# Patient Record
Sex: Female | Born: 1980 | Hispanic: Yes | Marital: Married | Smoking: Never smoker
Health system: Southern US, Community
[De-identification: ages and names within clinical notes are randomized; demographics above are authoritative.]

## PROBLEM LIST (undated history)

## (undated) DIAGNOSIS — Z789 Other specified health status: Secondary | ICD-10-CM

---

## 2001-10-15 ENCOUNTER — Inpatient Hospital Stay (HOSPITAL_COMMUNITY): Admission: AD | Admit: 2001-10-15 | Discharge: 2001-10-15 | Payer: Self-pay | Admitting: Obstetrics and Gynecology

## 2001-10-15 ENCOUNTER — Encounter (INDEPENDENT_AMBULATORY_CARE_PROVIDER_SITE_OTHER): Payer: Self-pay | Admitting: *Deleted

## 2001-10-16 ENCOUNTER — Ambulatory Visit (HOSPITAL_COMMUNITY): Admission: RE | Admit: 2001-10-16 | Discharge: 2001-10-16 | Payer: Self-pay | Admitting: Obstetrics and Gynecology

## 2002-01-14 ENCOUNTER — Inpatient Hospital Stay (HOSPITAL_COMMUNITY): Admission: AD | Admit: 2002-01-14 | Discharge: 2002-01-16 | Payer: Self-pay | Admitting: Family Medicine

## 2005-08-10 ENCOUNTER — Other Ambulatory Visit: Admission: RE | Admit: 2005-08-10 | Discharge: 2005-08-10 | Payer: Self-pay | Admitting: Gynecology

## 2005-11-05 ENCOUNTER — Inpatient Hospital Stay (HOSPITAL_COMMUNITY): Admission: AD | Admit: 2005-11-05 | Discharge: 2005-11-05 | Payer: Self-pay | Admitting: Gynecology

## 2005-11-22 ENCOUNTER — Inpatient Hospital Stay (HOSPITAL_COMMUNITY): Admission: AD | Admit: 2005-11-22 | Discharge: 2005-11-25 | Payer: Self-pay | Admitting: Gynecology

## 2005-11-22 ENCOUNTER — Encounter (INDEPENDENT_AMBULATORY_CARE_PROVIDER_SITE_OTHER): Payer: Self-pay | Admitting: *Deleted

## 2005-11-22 DIAGNOSIS — O321XX0 Maternal care for breech presentation, not applicable or unspecified: Secondary | ICD-10-CM

## 2006-01-07 ENCOUNTER — Other Ambulatory Visit: Admission: RE | Admit: 2006-01-07 | Discharge: 2006-01-07 | Payer: Self-pay | Admitting: Gynecology

## 2007-02-18 IMAGING — US US OB LIMITED
1 series · 14 of 22 positions shown · non-contrast
Comparison: none

CLINICAL DATA: 34.5 weeks pregnant.

[Series 1: us ob limited · 0.29mm/px · 14 of 22 slices shown]
[im 1/22]
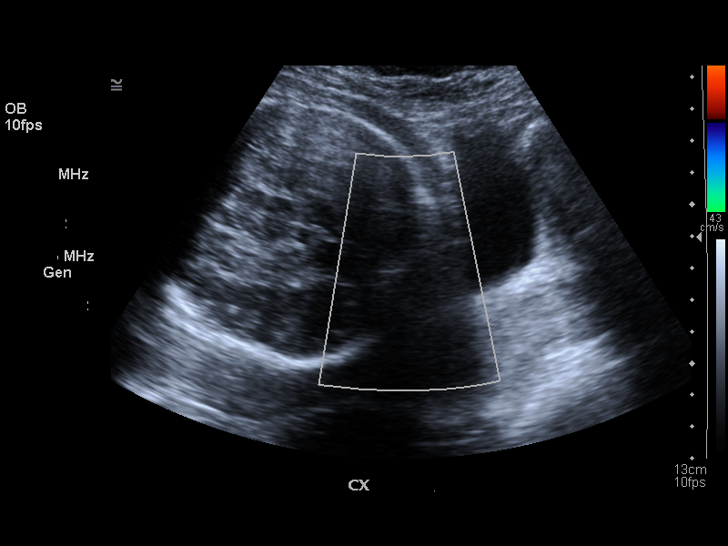
[im 3/22]
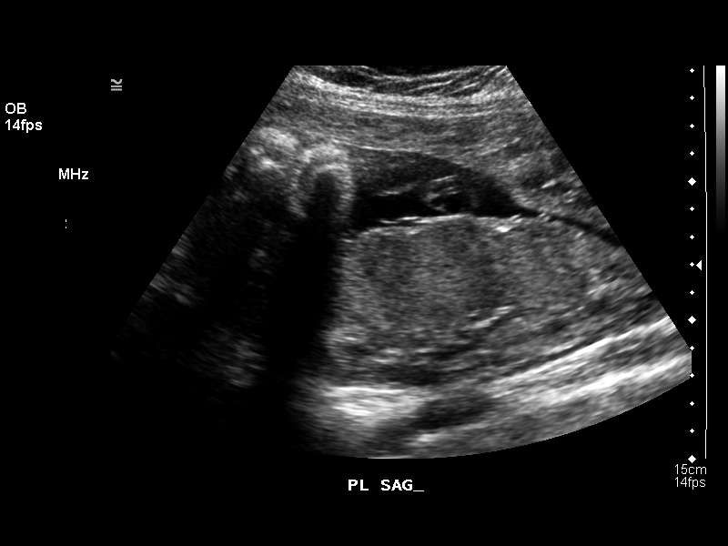
[im 4/22]
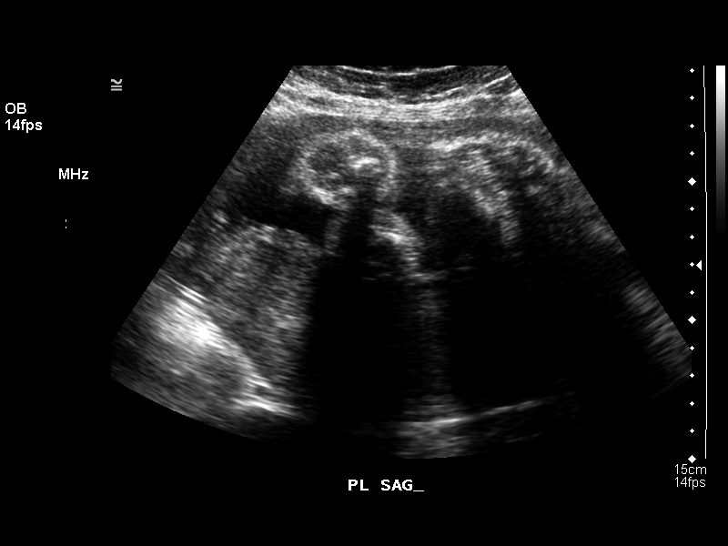
[im 6/22]
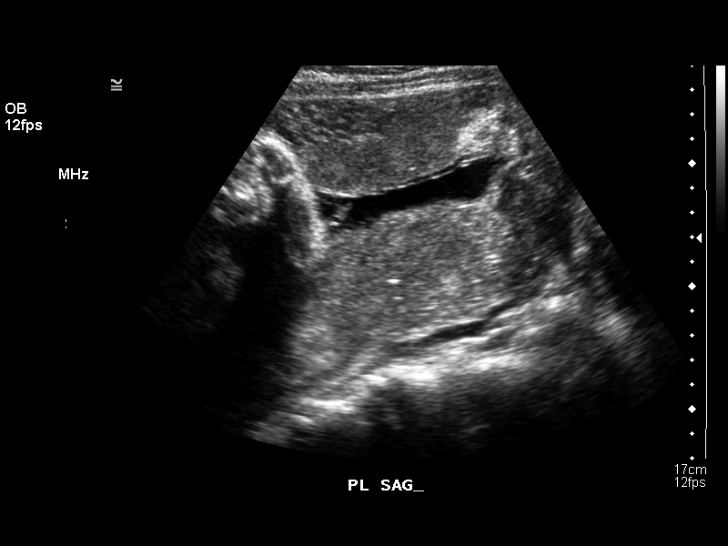
[im 8/22]
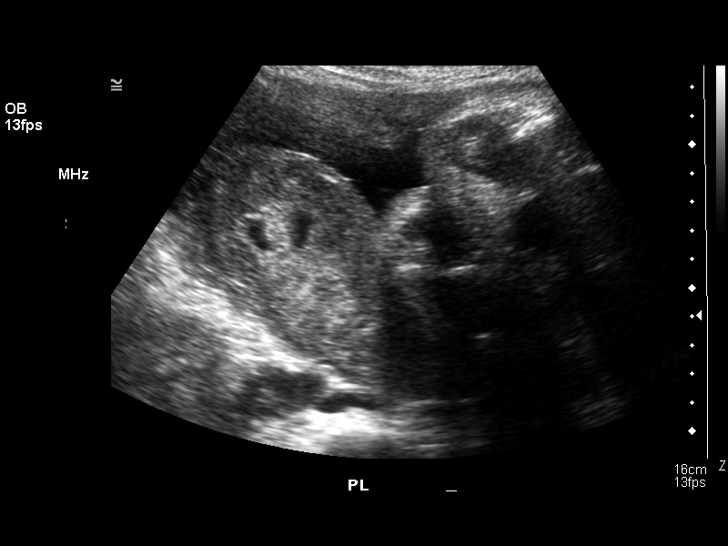
[im 9/22]
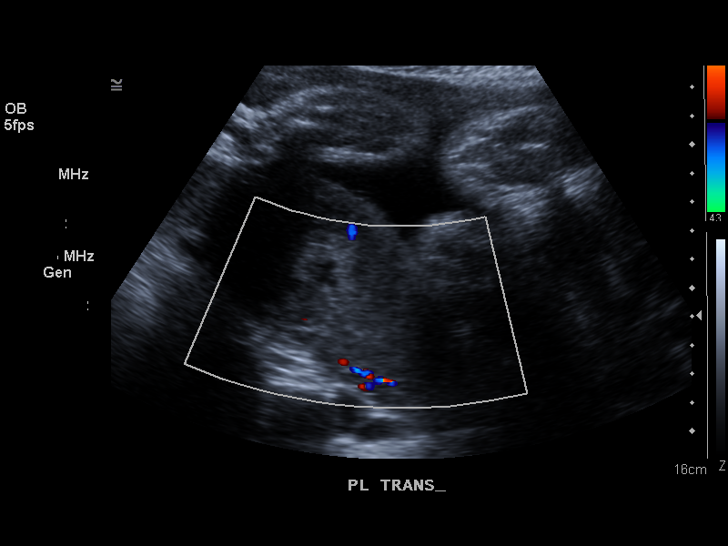
[im 11/22]
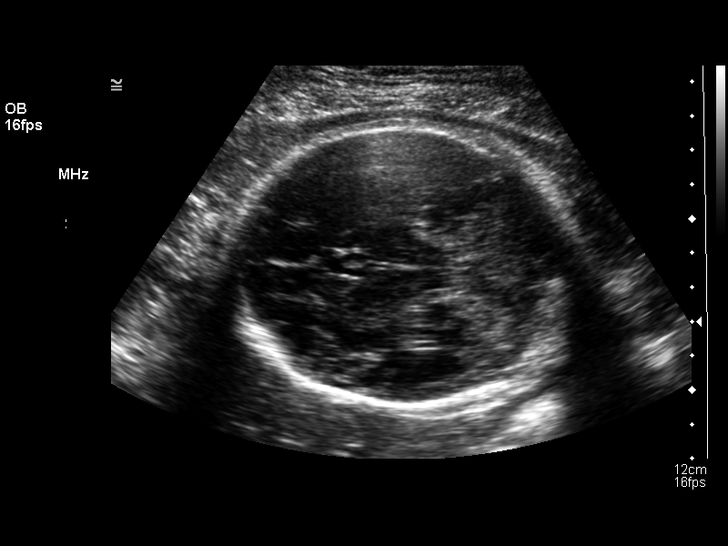
[im 12/22]
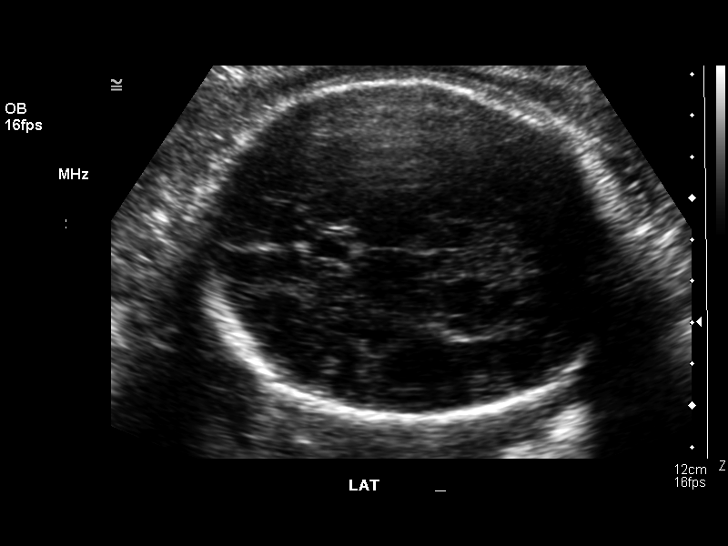
[im 14/22]
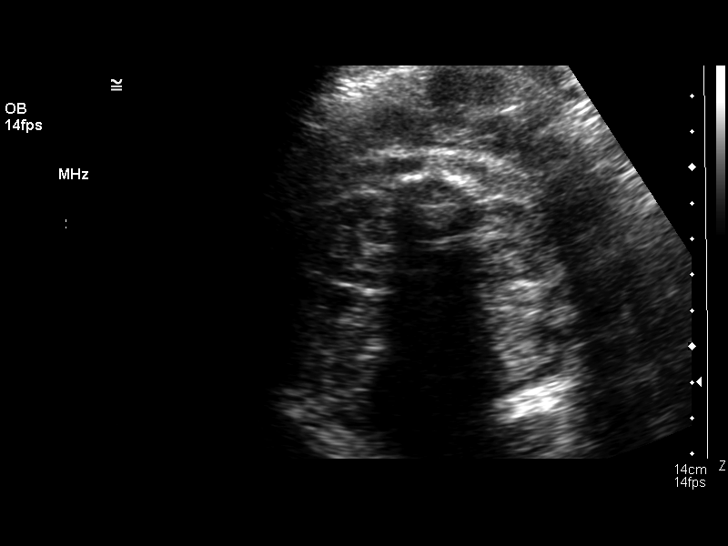
[im 15/22]
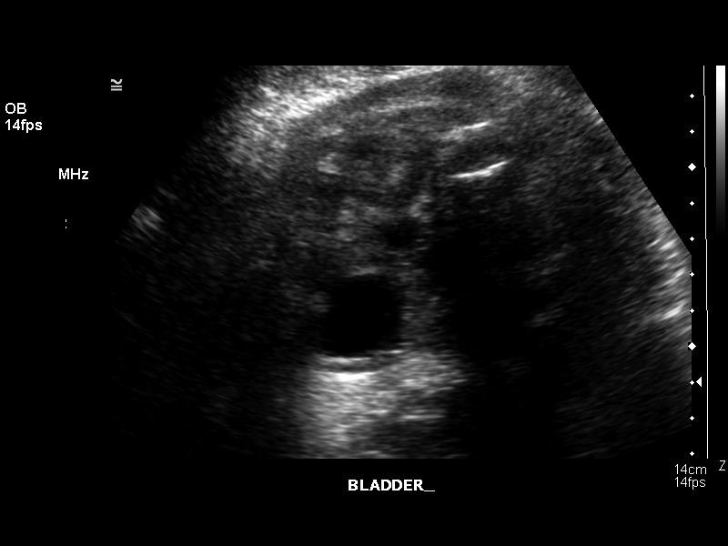
[im 17/22]
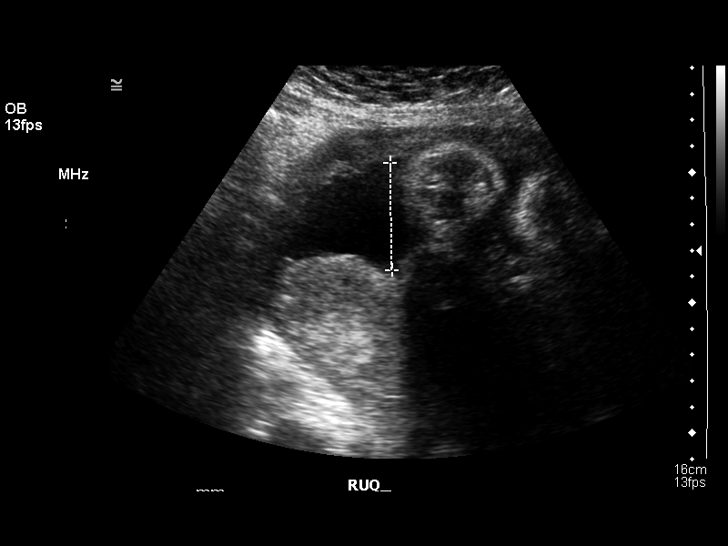
[im 19/22]
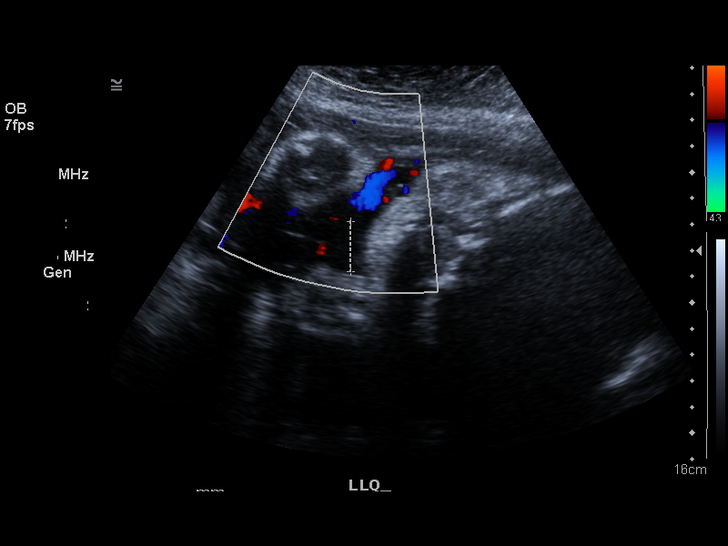
[im 20/22]
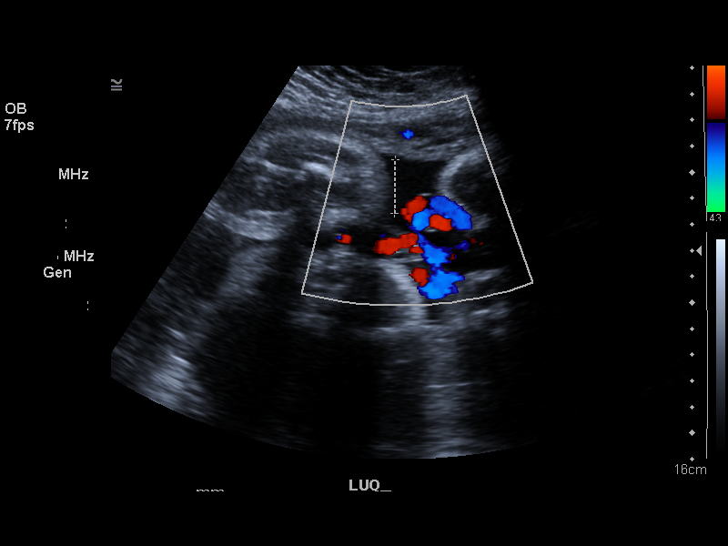
[im 22/22]
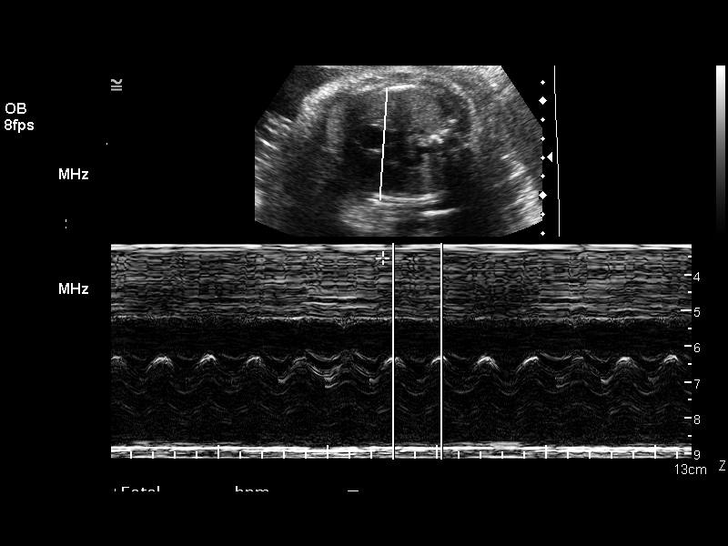

[14 of 22 positions shown; findings below may reference images not displayed]

LIMITED OBSTETRICAL ULTRASOUND:
Number of Fetuses:  1
Heart Rate:  136 bpm
Movement:  Yes
Breathing:  No
Presentation:  Cephalic
Placental Location:  Right lateral
Grade:  II
Previa:  No
Amniotic Fluid (Subjective):  Low normal
Amniotic Fluid (Objective):  AFI 9.7 cm (5th-15th %ile = 7.9 to 24.9 cm for 35 weeks) 

Fetal measurements and complete anatomic evaluation were not requested.  The following fetal anatomy was visualized during this exam:  Lateral ventricles, thalami, stomach, 3-vessel cord, kidneys, and bladder.

MATERNAL UTERINE AND ADNEXAL FINDINGS
Cervix:  Not evaluated;>34 weeks.
IMPRESSION: Single fetus in cephalic position.  Placenta is along the right lateral aspect and is grade II.  Quantity of amniotic fluid is diminished as noted above.

## 2009-05-09 ENCOUNTER — Other Ambulatory Visit: Admission: RE | Admit: 2009-05-09 | Discharge: 2009-05-09 | Payer: Self-pay | Admitting: Gynecology

## 2009-05-09 ENCOUNTER — Ambulatory Visit: Payer: Self-pay | Admitting: Gynecology

## 2010-07-21 NOTE — Op Note (Signed)
NAME:  RHENDA, [place]           ACCOUNT NO.:  [id number]   MEDICAL RECORD NO.:  [id number]          PATIENT TYPE:  INP   LOCATION:  9131                          FACILITY:  [facility]   PHYSICIAN:  Juan H. Lily Peer, M.D.DATE OF BIRTH:  12-10-1980   DATE OF PROCEDURE:  11/22/2005  DATE OF DISCHARGE:                                 OPERATIVE REPORT   INDICATIONS FOR OPERATION:  A 30 year old gravida 2, para 1 at term at 43-  1/[redacted] weeks gestation with oligohydramnios and incomplete breech presentation.   PREOPERATIVE DIAGNOSES:  1. Term intrauterine pregnancy at 37-1/2 weeks estimated gestational age.  2. Oligohydramnios.  3. Incomplete breech presentation.   POSTOPERATIVE DIAGNOSES:  1. Term intrauterine pregnancy at 37-1/2 weeks estimated gestational age.  2. Oligohydramnios.  3. Incomplete breech presentation.   PROCEDURE PERFORMED:  Primary lower uterine segment transverse cesarean  section.   SURGEON:  Juan H. Lily Peer, M.D.   ANESTHESIA:  Spinal.   FINDINGS:  A viable female infant, Apgars of 9 and 9, with a weight of 5  pounds 12 ounces, in an incomplete breech presentation, decreased amniotic  fluid and clear, and thin umbilical cord with no Wharton's jelly.  Normal  maternal pelvic anatomy.   DESCRIPTION OF OPERATION:  After the patient adequately counseled, she was  taken to the operating room where she underwent successful spinal  anesthesia.  The abdomen was prepped and draped in usual sterile fashion.  A  Foley catheter had been inserted in an effort to monitor urinary output.   A Pfannenstiel skin incision was made 2 cm above the symphysis pubis.  The  incision was carried down from the skin, subcutaneous tissue, down to the  rectus fascia.  Of note, 0.25% Marcaine for a total 10 cc had been  infiltrated in the planned incision site for postoperative analgesia.  The  fascia was incised in a transverse fashion.  The midline raphe was entered  cautiously.  The  bladder flap was established.  The lower uterine segment  was incised in a transverse fashion.  Clear, although diminished amniotic  fluid was present.  The fetus was delivered in the breech method/extraction,  the feet followed by the arms and then the head.  The nasopharyngeal area  was bulb suctioned.  The newborn gave immediate cry.  The cord was doubly  clamped and excised.  The infant was shown to the mother and passed off to  the pediatrician who was in attendance and gave the above-mentioned  parameters.  After cord blood was obtained, Ancef was given IV.  A Pitocin  drip was started.  The placenta and cord were delivered and passed off the  operative field and sent off for histological evaluation.  The uterus was  then exteriorized.  The intrauterine cavity was swept clear of remaining  products of conception and irrigated, and the transverse incision was closed  in a double layered fashion, first in a locking stitch fashion followed by  second layer in imbricating fashion with 0 Vicryl suture.  The uterus was  placed back in the pelvic cavity.  The pelvic cavity copiously irrigated  with normal saline solution.  After ascertaining adequate hemostasis,  closure was started.  The visceral peritoneum was not reapproximated, but  the rectus fascia was closed with a running stitch of 0 Vicryl suture.  The  subcutaneous bleeders were Bovie cauterized.  The skin was reapproximated  with skin clips followed by placing Xeroform gauze followed by a dressing.   The patient was transferred to the recovery room with stable vital signs.  Blood loss from procedure was recorded at 600 cc.  Urine output 300 cc,  and  IV fluids 3200 cc of  lactated Ringer's.      Juan H. Lily Peer, M.D.  Electronically Signed     JHF/MEDQ  D:  11/22/2005  T:  11/23/2005  Job:  [id number]

## 2010-07-21 NOTE — Discharge Summary (Signed)
NAME:  Amber Fuentes, Amber Fuentes           ACCOUNT NO.:  [id number]   MEDICAL RECORD NO.:  [id number]          PATIENT TYPE:  INP   LOCATION:  9131                          FACILITY:  [facility]   PHYSICIAN:  Ivor Costa. Farrel Gobble, M.D. DATE OF BIRTH:  [birth year]   DATE OF ADMISSION:  11/22/2005  DATE OF DISCHARGE:  11/25/2005                                 DISCHARGE SUMMARY   PRINCIPAL DIAGNOSIS:  Term pregnancy.   SECONDARY DIAGNOSES:  1. Oligohydramnios.  2. Breech presentation.   PROCEDURE:  Elective primary cesarean section.   HOSPITAL COURSE:  Refer to the dictated H&P.  The patient presented on the  morning of November 22, 2005, and underwent a primary cesarean section, low  flap transverse by Gaetano Hawthorne. Lily Peer, M.D. with delivery of a viable female,  birth weight 5 pounds 12 ounces, Apgars 9/9.  Postpartum course was  unremarkable.  The patient was transferred from the OR to the recovery room  and then to the postpartum floor in due fashion.  Postpartum course remained  unremarkable.  She remained afebrile and vital signs stable throughout.  On  postoperative day #3, the patient was breastfeeding without any complaints,  tolerating oral analgesics and ambulating without difficulty.   POSTOPERATIVE LABS:  Hemoglobin 9.7, hematocrit 28.3, white count 10, and  platelets 251.   She was discharged home in stable condition.  She was instructed to use over-  the-counter Motrin 600 mg every 6 hours p.r.n.  She was also given a  prescription for Percocet 5 one to two q.4 hours p.r.n. pain #40 without  refill.  She was instructed to follow up in the office 6 weeks  postoperatively.      Ivor Costa. Farrel Gobble, M.D.  Electronically Signed     THL/MEDQ  D:  11/25/2005  T:  11/27/2005  Job:  [id number]

## 2010-07-21 NOTE — H&P (Signed)
NAME:  Amber Fuentes, Amber Fuentes           ACCOUNT NO.:  [id number]   MEDICAL RECORD NO.:  [id number]          PATIENT TYPE:  INP   LOCATION:                                F[facility]ILITY:  [facility]   PHYSICIAN:  Juan H. Lily Peer, M.D.DATE OF BIRTH:  1980-08-17   DATE OF ADMISSION:  DATE OF DISCHARGE:                                HISTORY & PHYSICAL   CHIEF COMPLAINT:  1. Term intrauterine pregnancy at 37-1/2 weeks' gestation.  2. Oligohydramnios.  3. Incomplete breech.   HISTORY OF PRESENT ILLNESS:  The patient is a 30 year old gravida 2, para 1,  whose estimated date of confinement was scheduled to be October 6.  She is  currently 37-1/2 weeks' gestation.  She was a late entry into the practice  when she presented at 18-1/2 weeks' gestation, so a first trimester screen  was not done.  The patient did have alpha theta protein screening which was  found to be elevated, and a thorough ultrasound was done, at which time she  was found to have an AFI that was decreased to 10.1 in the sixth percentile  for 23-week gestation.  The patient had an extensive evaluation to include  consultation at [facility].  The patient also had been tested with the  following:  She had a comprehensive metabolic panel, liver function test,  renal function test.  Lupus anticoagulant, and toxoplasmosis, and  cytomegalovirus, syphilis, __________B19, and Herpes all were normal.  The  patient had thorough evaluation in [facility].  She had also been  offered amniocentesis and the patient had declined.  Initially she had a  placenta previa and many results marginal, with the last ultrasound  demonstrating the fetus to be in the incomplete breech presentation with a  low-lying placenta and fluid decrease in the previous scan on September 12,  whereby the AFI at that point had been 9.8 and it was down now to 7.7 and  the sixth percentile.  The patient is scheduled to undergo a primary  cesarean section tomorrow morning,  September 20.  The patient had been  followed by twice a week NSTs and AFI but due to decreasing amniotic fluid  index, her being at term, and baby in the incomplete breech presentation,  decided to proceed with a primary cesarean section.  The last complete  ultrasound for fetal growth had been done on September 12 and the fetus was  in the 21st percentile in growth.   PAST MEDICAL HISTORY:  The patient has had a normal spontaneous vaginal  delivery in 2003.  She denies any allergies.  No other medical problems  reported.   REVIEW OF SYSTEMS:  See [place] form.   PHYSICAL EXAMINATION:  VITAL SIGNS:  The patient today weighed 199 pounds,  blood pressure 122/82.  HEENT:  Unremarkable.  NECK:  Supple.  Trachea midline.  No carotid bruits.  No thyromegaly.  LUNGS:  Clear to auscultation without any rhonchi or wheezes.  HEART:  Regular rate and rhythm.  No murmurs or gallops.  BREASTS:  Exam not done.  ABDOMEN:  Gravid uterus, breech presentation by [facility] maneuver, confirmed  by ultrasound.  PELVIC:  Cervix long, closed, and __________.  EXTREMITIES:  Detail 1+ due to clonus.   LABORATORY DATA:  Prenatal labs:  O positive blood type, negative antibody  screen.  VDRL was nonreactive.  Rubella immune.  Hepatitis B surface antigen  and HIV were negative.  Alpha theta protein was found to be slightly  elevated.  No abnormalities were noted on ultrasound scan.  Diabetes screen  was normal.  GBS culture was negative.   ASSESSMENT:  A 30 year old gravida 2, para 1, at 37-1/2 weeks' gestation  with oligohydramnios, breech presentation.  No gross abnormality on  ultrasound.  The patient is being followed twice a week by NSTs and AFI.  Evidence of decreasing AFI.  The last one was today in the office with 7.7  cm at sixth percentile.  The fetus was found to be an incomplete breech and  a low-lying placenta was noted.  The patient was counseled as to risks,  benefits, and pros and cons of  cesarean section.  All questions were  answered and will follow accordingly.   PLAN:  The patient is scheduled for primary cesarean section on September 20  at 7:30 a.m. at [facility].      [place]ost[place] H. Lily Peer, M.D.  Electronically Signed     JHF/MEDQ  D:  11/21/2005  T:  11/21/2005  Job:  [id number]

## 2011-07-13 ENCOUNTER — Encounter: Payer: Self-pay | Admitting: Gynecology

## 2011-07-13 ENCOUNTER — Other Ambulatory Visit (HOSPITAL_COMMUNITY)
Admission: RE | Admit: 2011-07-13 | Discharge: 2011-07-13 | Disposition: A | Discharge status: Home / Self Care | Payer: 59 | Source: Ambulatory Visit | Attending: Gynecology | Admitting: Gynecology

## 2011-07-13 ENCOUNTER — Ambulatory Visit (INDEPENDENT_AMBULATORY_CARE_PROVIDER_SITE_OTHER): Payer: 59 | Admitting: Gynecology

## 2011-07-13 VITALS — BP 128/70 | Ht 62.25 in | Wt 185.0 lb

## 2011-07-13 DIAGNOSIS — Z01419 Encounter for gynecological examination (general) (routine) without abnormal findings: Secondary | ICD-10-CM

## 2011-07-13 DIAGNOSIS — R635 Abnormal weight gain: Secondary | ICD-10-CM

## 2011-07-13 NOTE — Progress Notes (Signed)
Amber Fuentes [facility] 16-May-1980 [id number]   History:    31 y.o.  for annual exam who has not been seen the office in 2 years. She reports normal menstrual cycles and has been using condoms for contraception. Review of her record indicated that her weight has remained stable for the past 2 years but she is overweight with a BMI of 33.57. Patient's otherwise asymptomatic.  Past medical history,surgical history, family history and social history were all reviewed and documented in the EPIC chart.  Gynecologic History Patient's last menstrual period was 06/24/2011. Contraception: condoms Last Pap: 2011. Results were: normal Last mammogram: Not indicated. Results were: Not indicated  Obstetric History OB History    Grav Para Term Preterm Abortions TAB SAB Ect Mult Living   2 2 2       2      # Outc Date GA Lbr Len/2nd Wgt Sex Del Anes PTL Lv   1 TRM     M SVD  No Yes   2 TRM     F CS  No Yes       ROS:  Was performed and pertinent positives and negatives are included in the history.  Exam: chaperone present  BP 128/70  Ht 5' 2.25" (1.581 m)  Wt 185 lb (83.915 kg)  BMI 33.57 kg/m2  LMP 06/24/2011  Body mass index is 33.57 kg/(m^2).  General appearance : Well developed well nourished female. No acute distress HEENT: Neck supple, trachea midline, no carotid bruits, no thyroidmegaly Lungs: Clear to auscultation, no rhonchi or wheezes, or rib retractions  Heart: Regular rate and rhythm, no murmurs or gallops Breast:Examined in sitting and supine position were symmetrical in appearance, no palpable masses or tenderness,  no skin retraction, no nipple inversion, no nipple discharge, no skin discoloration, no axillary or supraclavicular lymphadenopathy Abdomen: no palpable masses or tenderness, no rebound or guarding Extremities: no edema or skin discoloration or tenderness  Pelvic:  Bartholin, Urethra, Skene Glands: Within normal limits             Vagina: No gross lesions or  discharge  Cervix: No gross lesions or discharge  Uterus  anteverted, normal size, shape and consistency, non-tender and mobile  Adnexa  Without masses or tenderness  Anus and perineum  normal   Rectovaginal  normal sphincter tone without palpated masses or tenderness             Hemoccult no     Assessment/Plan:  31 y.o. female for annual exam for which her Pap smear was done today. We discussed new snare screening guidelines. Due to her being overweight a TSH along with a fasting blood sugar and fasting lipid profile will be drawn today along with her CBC and urinalysis. Literature information in Spanish was provided on the ParaGard T380A nonhormonal IUD. She'll discuss with her husband and schedule accordingly. She was reminded do her monthly self breast examination. Literature information on exercise and on diet was provided in Spanish as well. All questions answered we'll follow accordingly.    Ok Edwards MD, 4:06 PM 07/13/2011

## 2011-07-13 NOTE — Patient Instructions (Addendum)
Control del colesterol  Los niveles de colesterol en el organismo estn determinados significativamente por su dieta. Los niveles de colesterol tambin se relacionan con la enfermedad cardaca. El material que sigue ayuda a Software engineer relacin y a Chiropractor qu puede hacer para mantener su corazn sano. No todo el colesterol es [place]. Las lipoprotenas de baja densidad (LDL) forman el colesterol "malo". El colesterol malo puede ocasionar depsitos de grasa que se acumulan en el interior de las arterias. Las lipoprotenas de alta densidad (HDL) es el colesterol "bueno". Ayuda a remover el colesterol LDL "malo" de la [place]. El colesterol es un factor de riesgo muy importante para la enfermedad cardaca. Otros factores de riesgo son la hipertensin arterial, el hbito de fumar, el estrs, la herencia y [place].  El msculo cardaco obtiene el suministro de sangre a travs de las arterias coronarias. Si su colesterol LDL ("malo") est elevado y el HDL ("bueno") es bajo, tiene un factor de riesgo para que se formen depsitos de Holiday representative en las arterias coronarias (los vasos sanguneos que suministran sangre al corazn). Esto hace que haya menos lugar para que la sangre circule. Sin la suficiente sangre y oxgeno, el msculo cardaco no puede funcionar correctamente, y usted podr sentir dolores en el pecho (angina pectoris). Cuando una arteria coronaria se cierra completamente, una parte del msculo cardaco puede morir (infarto de miocardio). CONTROL DEL COLESTEROL Cuando el profesional que lo asiste enva la sangre al laboratorio para Artist nivel de colesterol, puede realizarle tambin un perfil completo de los lpidos. Con esta prueba, se puede determinar la cantidad total de colesterol, as como los niveles de LDL y HDL. Los triglicridos son un tipo de grasa que circula en la sangre y que tambin puede utilizarse para determinar el riesgo de enfermedad  cardaca. En la siguiente tabla se establecen los nmeros ideales: Prueba: Colesterol total  Menos de 200 mg/dl.  Prueba: LDL "colesterol malo"  Menos de 100 mg/dl.   Menos de 70 mg/dl si tiene riesgo muy elevado de sufrir un ataque cardaco o muerte cardaca sbita.  Prueba: HDL "colesterol bueno"  Mujeres: Ms de 50 mg/dl.   Hombres: Ms de 40 mg/dl.  Prueba: Trigliceridos  Menos de 150 mg/dl.  CONTROL DEL COLESTEROL CON DIETA Aunque factores como el ejercicio y el estilo de vida son importantes, la "primera lnea de ataque" es la dieta. Esto se debe a que se sabe que ciertos alimentos hacen subir el colesterol y otros lo [place]. El objetivo debe ser [employer] alimentos, de modo que tengan un efecto sobre el colesterol y, an ms importante, Microbiologist las grasas saturadas y trans con otros tipos de grasas, como las monoinsaturadas y las poliinsaturadas y cidos grasos omega-3 . En promedio, una persona no debe consumir ms de 15 a 17 g de grasas saturadas por [employer]. Las grasas saturadas y trans se consideran grasas "malas", ya que elevan el colesterol LDL. Las grasas saturadas se encuentran principalmente en productos animales como carne, Miles y crema. Pero esto no significa que usted Marketing executive todas sus comidas favoritas. Actualmente, como lo muestra el cuadro que figura al final de este documento, hay sustitutos de buen sabor, bajos en grasas y en colesterol, para la mayora de los alimentos que a usted Musician. Elija aquellos alimentos alternativos que sean bajos en grasas o sin grasas. Elija cortes de carne del cuarto trasero o lomo ya que estos cortes son los que tienen menor cantidad de grasa y Oncologist. El pollo (  sin piel), el pescado, la carne de ternera, y la [place] molida son excelentes opciones. Elimine las carnes [employer] o el salami. Los [employer] o nada de grasas saturadas. Cuando consuma carne [place], carne de aves de  corral, o pescado, hgalo en porciones de 85 gramos (3 onzas). Las grasas trans tambin se llaman "aceites parcialmente hidrogenados". Son aceites manipulados cientficamente de [place] que son slidos a Publishing rights manager, tienen una larga vida y Glass blower/designer sabor y la textura de los alimentos a los que se Scientist, clinical (histocompatibility and immunogenetics). Las grasas trans se encuentran en la [place], [place], crackers y alimentos horneados.  Para hornear y cocinar, el aceite es un excelente sustituto para la [place]. Los aceites monoinsaturados tienen un beneficio particular, ya que se cree que disminuyen el colesterol LDL (colesterol malo) y elevan el HDL. Deber evitar los aceites tropicales saturados como el de coco y el de [place].  Recuerde, adems, que puede comer sin restricciones los grupos de alimentos que son naturalmente libres de grasas saturadas y Neurosurgeon trans, entre los que se incluyen el pescado, las frutas (excepto el [place]), verduras, frijoles, cereales (cebada, arroz, [place], trigo) y las pastas (sin salsas con crema)  IDENTIFIQUE LOS ALIMENTOS QUE DISMINUYEN EL COLESTEROL  Pueden disminuir el colesterol las fibras solubles que estn en las frutas, como las [place], en los vegetales como el brcoli, las patatas y las zanahorias; en las legumbres como frijoles, guisantes y Therapist, occupational; y en los cereales como la cebada. Los alimentos fortificados con fitosteroles tambin Engineer, production. Debe consumir al menos 2 g de estos alimentos a diario para Financial planner de disminucin de [place].  En el supermercado, lea las etiquetas de los envases para identificar los alimentos bajos en grasas saturadas, libres de grasas trans y bajos en [place], . Elija quesos que tengan solo de 2 a 3 g de grasa saturada por onza (28,35 g). Use una margarina que no dae el corazn, [place] de grasas trans o aceite parcialmente hidrogenado. Al comprar alimentos horneados (galletitas dulces y Gaffer) evite el aceite parcialmente  hidrogenado. Los panes y bollos debern ser de granos enteros (harina de maz o de avena entera, en lugar de "harina" o "harina enriquecida"). Compre sopas en lata que no sean cremosas, con bajo contenido de sal y sin grasas adicionadas.  TCNICAS DE PREPARACIN DE LOS ALIMENTOS  Nunca fra los alimentos en aceite abundante. Si debe frer, hgalo en poco aceite y removiendo [place], porque as se utilizan muy pocas grasas, o utilice un spray antiadherente. Cuando le sea posible, hierva, hornee o ase las carnes y cocine los vegetales al vapor. En vez de [employer] con mantequilla o [place], utilice limn y hierbas, pur de Psychologist, educational y canela (para las calabazas y batatas), yogurt y salsa descremados y aderezos para ensaladas bajos en contenido graso.  BAJO EN GRASAS SATURADAS / SUSTITUTOS BAJOS EN GRASA  Carnes / Grasas saturadas (g)  Evite: Bife, corte graso (3 oz/85 g) / 11 g   Elija: Bife, corte magro (3 oz/85 g) / 4 g   Evite: Hamburguesa (3 oz/85 g) / 7 g   Elija:  [place] magra (3 oz/85 g) / 5 g   Evite: Jamn (3 oz/85 g) / 6 g   Elija:  Jamn magro (3 oz/85 g) / 2.4 g   Evite: Pollo, con piel (3 oz/85 g), Carne oscura / 4 g   Elija:  Pollo, sin piel (3 oz/85 g), Carne oscura / 2  g   Evite: Pollo, con piel (3 oz/85 g), Carne magra / 2.5 g   Elija: Pollo, sin piel (3 oz/85 g), Carne magra / 1 g  Lcteos / Grasas saturadas (g)  Evite: Leche entera (1 taza) / 5 g   Elija: Leche con bajo contenido de grasa, 2% (1 taza) / 3 g   Elija: Leche con bajo contenido de grasa, 1% (1 taza) / 1.5 g   Elija: Leche descremada (1 taza) / 0.3 g   Evite: Queso duro (1 oz/28 g) / 6 g   Elija: Queso descremado (1 oz/28 g) / 2-3 g   Evite: Queso cottage, 4% grasa (1 taza)/ 6.5 g   Elija: Queso cottage con bajo contenido de grasa, 1% grasa (1 taza)/ 1.5 g   Evite: Helado (1 taza) / 9 g   Elija: Sorbete (1 taza) / 2.5 g   Elija: Yogurt helado sin contenido de grasa  (1 taza) / 0.3 g   Elija: Barras de fruta congeladas / vestigios   Evite: Crema batida (1 cucharada) / 3.5 g   Elija: Batidos glac sin lcteos (1 cucharada) / 1 g  Condimentos / Grasas saturadas (g)  Evite: Mayonesa (1 cucharada) / 2 g   Elija: Mayonesa con bajo contenido de grasa (1 cucharada) / 1 g   Evite: [place] (1 cucharada) / 7 g   Elija: Margarina extra light (1 cucharada) / 1 g   Evite: Aceite de coco (1 cucharada) / 11.8 g   Elija: Aceite de oliva (1 cucharada) / 1.8 g   Elija: Aceite de maz (1 cucharada) / 1.7 g   Elija: Aceite de crtamo (1 cucharada) / 1.2 g   Elija: Aceite de girasol (1 cucharada) / 1.4 g   Elija: Aceite de soja (1 cucharada) / 2.4 g   Elija: Aceite de canola (1 cucharada) / 1 g  Document Released: 02/19/2005 Document Revised: 11/01/2010 [facility] Patient Information 2012 [place], [place].  Ejercicios para perder peso (Exercise to Lose Weight) La actividad fsica y [place] Dear dieta saludable ayudan a perder peso. El mdico podr sugerirle ejercicios especficos. IDEAS Y CONSEJOS PARA HACER EJERCICIOS  Elija opciones econmicas que disfrute hacer , como caminar, andar en bicicleta o los vdeos para ejercitarse.   Utilice las Microbiologist del ascensor.   Camine durante la hora del almuerzo.   Estacione el auto lejos del lugar de [place] o [place].   Concurra a un gimnasio o tome clases de gimnasia.   Comience con 5  10 minutos de actividad fsica por da. Ejercite hasta 30 minutos, 4 a 6 das por [address]   Utilice zapatos que tengan un buen soporte y ropas cmodas.   Elongue antes y despus de Company secretary.   Ejercite hasta que aumente la respiracin y el corazn palpite rpido.   Beba agua extra cuando ejercite.   No haga ejercicio Firefighter, sentirse mareado o que le falte mucho el aire.  La actividad fsica puede quemar alrededor de 150 caloras.  Correr 20 cuadras en 15 minutos.   Jugar vley durante 45 a 60 minutos.     Limpiar y encerar el auto durante 45 a 60 minutos.   Jugar ftbol americano de toque.   Caminar 25 cuadras en 35 minutos.   Empujar un cochecito 20 cuadras en 30 minutos.   Jugar baloncesto durante 30 minutos.   Rastrillar hojas secas durante 30 minutos.   Andar en bicicleta 80 cuadras en 30 minutos.   Caminar 30 cuadras en  30 minutos.   Bailar durante 30 minutos.   Quitar la nieve con una pala durante 15 minutos.   Nadar vigorosamente durante 20 minutos.   Subir escaleras durante 15 minutos.   Andar en bicicleta 60 cuadras durante 15 minutos.   Arreglar el jardn entre 30 y 45 minutos.   Saltar a la soga durante 15 minutos.   Limpiar vidrios o pisos durante 45 a 60 minutos.  Document Released: 05/26/2010 Document Revised: 11/01/2010 [facility] Patient Information 2012 [place], [place].   Informacin sobre el dispositivo intrauterino  (Intrauterine Device Information) El dispositivo intrauterino (DIU) se inserta en el tero e impide el embarazo. Hay dos tipos de DIU:   DIU de cobre. Este tipo de DIU est recubierto con un alambre de cobre y se inserta dentro del tero. El cobre hace que el tero y las trompas de [place] produzcan un liquido que [employer] espermatozoides. El DIU de cobre puede Geneticist, molecular durante 10 aos.   DIU hormonal. Este tipo de DIU contiene la hormona progestina (progesterona sinttica). La hormona espesa el moco cervical y evita que los espermatozoides ingresen al tero y tambin afina la membrana que cubre el tero para evitar la implantacin del vulo fertilizado. La hormona debilita o destruye los espermatozoides que ingresan al tero. El DIU hormonal puede Geneticist, molecular durante 5 aos.  El mdico se asegurar de que usted es una buena candidata para usar el DIU cono anticonceptivo. Hable con su mdico acerca de los posibles efectos secundarios.  VENTAJAS  Es muy eficaz, reversible, de accin prolongada y de bajo  mantenimiento.   No hay efectos secundarios relacionados con el estrgeno.   El DIU puede ser utilizado durante la Market researcher.   No est asociado con el aumento de [place].   Funciona inmediatamente despus de la insercin.   El DIU de cobre no interfiere con las hormonas femeninas.   El DIU con progesterona puede hacer que los perodos menstruales no sean tan abundantes.   El DIU de progesterona puede usarse durante 5 aos.   El DIU de cobre puede usarse durante 10 aos.  DESVENTAJAS  El DIU de progesterona puede estar asociado con patrones de sangrado irregular.   El DIU de cobre puede hacer que el flujo menstrual ms abundante y doloroso.   Puede experimentar clicos y sangrado vaginal despus de la insercin.  Document Released: 08/09/2009 Document Revised: 02/08/2011 [facility] Patient Information 2012 [place], [place].

## 2011-07-14 LAB — URINALYSIS W MICROSCOPIC + REFLEX CULTURE
Crystals: NONE SEEN
Glucose, UA: NEGATIVE mg/dL
Leukocytes, UA: NEGATIVE
Specific Gravity, Urine: 1.029 (ref 1.005–1.030)
Squamous Epithelial / LPF: NONE SEEN
Urobilinogen, UA: 0.2 mg/dL (ref 0.0–1.0)

## 2011-07-14 LAB — CBC WITH DIFFERENTIAL/PLATELET
Basophils Absolute: 0 10*3/uL (ref 0.0–0.1)
Basophils Relative: 0 % (ref 0–1)
Eosinophils Relative: 0 % (ref 0–5)
HCT: 40 % (ref 36.0–46.0)
Hemoglobin: 12.8 g/dL (ref 12.0–15.0)
Lymphocytes Relative: 26 % (ref 12–46)
MCH: 26.9 pg (ref 26.0–34.0)
MCHC: 32 g/dL (ref 30.0–36.0)
Monocytes Absolute: 0.3 10*3/uL (ref 0.1–1.0)
Neutro Abs: 5.3 10*3/uL (ref 1.7–7.7)
Neutrophils Relative %: 69 % (ref 43–77)
Platelets: 347 10*3/uL (ref 150–400)
RBC: 4.75 MIL/uL (ref 3.87–5.11)
RDW: 14.7 % (ref 11.5–15.5)

## 2011-07-14 LAB — GLUCOSE, RANDOM: Glucose, Bld: 70 mg/dL (ref 70–99)

## 2011-07-14 LAB — TSH: TSH: 1.591 u[IU]/mL (ref 0.350–4.500)

## 2011-07-14 LAB — LIPID PANEL
LDL Cholesterol: 82 mg/dL (ref 0–99)
Triglycerides: 95 mg/dL (ref <150)

## 2013-06-12 ENCOUNTER — Ambulatory Visit (INDEPENDENT_AMBULATORY_CARE_PROVIDER_SITE_OTHER): Payer: 59 | Admitting: Gynecology

## 2013-06-12 ENCOUNTER — Other Ambulatory Visit (HOSPITAL_COMMUNITY)
Admission: RE | Admit: 2013-06-12 | Discharge: 2013-06-12 | Disposition: A | Discharge status: Home / Self Care | Payer: 59 | Source: Ambulatory Visit | Attending: Gynecology | Admitting: Gynecology

## 2013-06-12 ENCOUNTER — Encounter: Payer: Self-pay | Admitting: Gynecology

## 2013-06-12 VITALS — BP 112/70 | Ht 62.25 in | Wt 192.8 lb

## 2013-06-12 DIAGNOSIS — N898 Other specified noninflammatory disorders of vagina: Secondary | ICD-10-CM

## 2013-06-12 DIAGNOSIS — Z01419 Encounter for gynecological examination (general) (routine) without abnormal findings: Secondary | ICD-10-CM | POA: Insufficient documentation

## 2013-06-12 DIAGNOSIS — Z23 Encounter for immunization: Secondary | ICD-10-CM

## 2013-06-12 DIAGNOSIS — R635 Abnormal weight gain: Secondary | ICD-10-CM

## 2013-06-12 DIAGNOSIS — Z309 Encounter for contraceptive management, unspecified: Secondary | ICD-10-CM

## 2013-06-12 DIAGNOSIS — Z1151 Encounter for screening for human papillomavirus (HPV): Secondary | ICD-10-CM | POA: Insufficient documentation

## 2013-06-12 DIAGNOSIS — L293 Anogenital pruritus, unspecified: Secondary | ICD-10-CM

## 2013-06-12 LAB — CBC WITH DIFFERENTIAL/PLATELET
BASOS ABS: 0 10*3/uL (ref 0.0–0.1)
BASOS PCT: 0 % (ref 0–1)
EOS ABS: 0.2 10*3/uL (ref 0.0–0.7)
EOS PCT: 2 % (ref 0–5)
HEMATOCRIT: 41.1 % (ref 36.0–46.0)
Hemoglobin: 13.5 g/dL (ref 12.0–15.0)
Lymphocytes Relative: 30 % (ref 12–46)
Lymphs Abs: 2.4 10*3/uL (ref 0.7–4.0)
MCH: 26.8 pg (ref 26.0–34.0)
MCHC: 32.8 g/dL (ref 30.0–36.0)
MCV: 81.7 fL (ref 78.0–100.0)
MONO ABS: 0.4 10*3/uL (ref 0.1–1.0)
Monocytes Relative: 5 % (ref 3–12)
Neutro Abs: 5.1 10*3/uL (ref 1.7–7.7)
Neutrophils Relative %: 63 % (ref 43–77)
PLATELETS: 319 10*3/uL (ref 150–400)
RBC: 5.03 MIL/uL (ref 3.87–5.11)
RDW: 14.9 % (ref 11.5–15.5)
WBC: 8.1 10*3/uL (ref 4.0–10.5)

## 2013-06-12 LAB — WET PREP FOR TRICH, YEAST, CLUE
Clue Cells Wet Prep HPF POC: NONE SEEN
TRICH WET PREP: NONE SEEN
YEAST WET PREP: NONE SEEN

## 2013-06-12 NOTE — Progress Notes (Signed)
[facility] [birth year] [id number]   History:    33 y.o.  for annual gyn exam with a complaint of vulvar pruritus and several months ago had some discomfort her right lower abdomen that has resolved. Patient has not been seen in the office for 2 years. She is using condoms for contraception. Is interested in the Nexplanon for contraception. She is in a steady monogamous relationship. She denies any prior history of abnormal Pap smears. She is having normal menstrual cycles.  Past medical history,surgical history, family history and social history were all reviewed and documented in the EPIC chart.  Gynecologic History Patient's last menstrual period was 05/23/2013. Contraception: condoms Last Pap: 2011. Results were: normal Last mammogram: Not indicated. Results were: Not indicated  Obstetric History OB History  Gravida Para Term Preterm AB SAB TAB Ectopic Multiple Living  2 2 2       2     # Outcome Date GA Lbr Len/2nd Weight Sex Delivery Anes PTL Lv  2 TRM     F CS  N Y  1 TRM     M SVD  N Y       ROS: A ROS was performed and pertinent positives and negatives are included in the history.  GENERAL: No fevers or chills. HEENT: No change in vision, no earache, sore throat or sinus congestion. NECK: No pain or stiffness. CARDIOVASCULAR: No chest pain or pressure. No palpitations. PULMONARY: No shortness of breath, cough or wheeze. GASTROINTESTINAL: No abdominal pain, nausea, vomiting or diarrhea, melena or bright red blood per rectum. GENITOURINARY: No urinary frequency, urgency, hesitancy or dysuria. MUSCULOSKELETAL: No joint or muscle pain, no back pain, no recent trauma. DERMATOLOGIC: No rash, no itching, no lesions. ENDOCRINE: No polyuria, polydipsia, no heat or cold intolerance. No recent change in weight. HEMATOLOGICAL: No anemia or easy bruising or bleeding. NEUROLOGIC: No headache, seizures, numbness, tingling or weakness. PSYCHIATRIC: No depression, no loss of interest in  normal activity or change in sleep pattern.     Exam: chaperone present  BP 112/70  Ht 5' 2.25" (1.581 m)  Wt 192 lb 12.8 oz (87.454 kg)  BMI 34.99 kg/m2  LMP 05/23/2013  Body mass index is 34.99 kg/(m^2).  General appearance : Well developed well nourished female. No acute distress HEENT: Neck supple, trachea midline, no carotid bruits, no thyroidmegaly Lungs: Clear to auscultation, no rhonchi or wheezes, or rib retractions  Heart: Regular rate and rhythm, no murmurs or gallops Breast:Examined in sitting and supine position were symmetrical in appearance, no palpable masses or tenderness,  no skin retraction, no nipple inversion, no nipple discharge, no skin discoloration, no axillary or supraclavicular lymphadenopathy Abdomen: no palpable masses or tenderness, no rebound or guarding Extremities: no edema or skin discoloration or tenderness  Pelvic:  Bartholin, Urethra, Skene Glands: Within normal limits             Vagina: No gross lesions or discharge  Cervix: No gross lesions or discharge  Uterus  anteverted, normal size, shape and consistency, non-tender and mobile  Adnexa  Without masses or tenderness  Anus and perineum  normal   Rectovaginal  normal sphincter tone without palpated masses or tenderness             Hemoccult not indicated   Wet prep few bacteria few WBC  Assessment/Plan:  33 y.o. female for annual exam with nonspecific vulvovaginitis will be prescribed Diflucan 150 mg one by mouth today. She can use MetroGel vaginal cream each  bedtime for 7 days. Literature information on the Nexplanon was provided and she will schedule accordingly. Pap smear was done today in accordance to the new guidelines. The following labs were ordered: CBC, TSH, fasting lipid profile, comprehensive metabolic panel, and urinalysis. Patient received her Tdap vaccine today.  Note: This dictation was prepared with  Dragon/digital dictation along withSmart phrase technology. Any  transcriptional errors that result from this process are unintentional.   Ok Edwards MD, 11:50 AM 06/12/2013

## 2013-06-12 NOTE — Patient Instructions (Signed)
Etonogestrel implant Qu es este medicamento? El ETONOGESTREL es un dispositivo anticonceptivo (control de la natalidad). Se utiliza para Neurosurgeonprevenir el embarazo. Se puede utilizar hasta 3 aos. Este medicamento puede ser utilizado para otros usos; si tiene alguna pregunta consulte con su proveedor de atencin mdica o con su farmacutico. MARCAS COMERCIALES DISPONIBLES: Implanon, Nexplanon  Qu le debo informar a mi profesional de la salud antes de tomar este medicamento? Necesita saber si usted presenta alguno de los siguientes problemas o situaciones: -sangrado vaginal anormal -enfermedad vascular o cogulos sanguneos -cncer de mama, cervical, heptico -depresin -diabetes -enfermedad de la vescula biliar -dolores de cabeza -enfermedad cardiaca o ataque cardiaco reciente -alta presin sangunea -alto nivel de colesterol -enfermedad renal -enfermedad heptica -convulsiones -fuma tabaco -una reaccin alrgica o inusual al etonogestrel, otras hormonas, anestsicos o antispticos, medicamentos, alimentos, colorantes o conservantes -si est embarazada o buscando quedar embarazada -si est amamantando a un beb Cmo debo utilizar este medicamento? Este dispositivo se inserta debajo de la piel en la cara interna de la parte superior del brazo por un profesional de Radiographer, therapeuticla salud. Hable con su pediatra para informarse acerca del uso de este medicamento en nios. Puede requerir atencin especial. Sobredosis: Pngase en contacto inmediatamente con un centro toxicolgico o una sala de urgencia si usted cree que haya tomado demasiado medicamento. ATENCIN: Reynolds AmericanEste medicamento es solo para usted. No comparta este medicamento con nadie. Qu sucede si me olvido de una dosis? No se aplica en este caso. Qu puede interactuar con este medicamento? No tome esta medicina con ninguno de los siguientes medicamentos: -amprenavir -bosentano -fosamprenavir  Esta medicina tambin puede interactuar con los  siguientes medicamentos: -medicamentos barbitricos para inducir el sueo o tratar convulsiones -ciertos medicamentos para las infecciones micticas tales como quetoconazol e itraconazol -griseofulvina -medicamentos para tratar convulsiones, tales como carbamazepina, felbamato, oxcarbazepina, fenitona, topiramato -modafinil -fenilbutazona -rifampicina -algunos medicamentos para tratar la infeccin por VIH tales como atazanavir, indinavir, lopinavir, nelfinavir, tipranavir, ritonavir -hierba de [address] FloorSan [place] Puede ser que esta lista no menciona todas las posibles interacciones. Informe a su profesional de Beazer Homesla salud de [employer] Inctodos los productos a base de hierbas, medicamentos de Graceventa libre o suplementos nutritivos que est tomando. Si usted fuma, consume bebidas alcohlicas o si utiliza drogas ilegales, indqueselo tambin a su profesional de Beazer Homesla salud. Algunas sustancias pueden interactuar con su medicamento. A qu debo estar atento al usar PPL Corporationeste medicamento? Este medicamento no protege contra la infeccin por el VIH (SIDA) o otras enfermedades de transmisin sexual. Usted debe sentir el implante al presionar con las yemas de los dedos sobre la piel donde se insert. Dgale a su mdico si no se siente el implante. Qu efectos secundarios puedo tener al Boston Scientificutilizar este medicamento? Efectos secundarios que debe informar a su mdico o a Producer, television/film/videosu profesional de la salud tan pronto como sea posible: -Therapist, artreacciones alrgicas como erupcin cutnea, picazn o urticarias, hinchazn de la cara, labios o lengua -ndulos mamarios -cambios en la visin -confusin, dificultad para hablar o entender -orina de color oscura -humor deprimido -sensacin general de estar enfermo o sntomas gripales -heces claras -prdida del apetito, nuseas -dolor en la regin abdominal superior derecha -dolores de cabeza severos -Engineer, miningdolor, hinchazon o sensibilidad grave en el abdomen -falta de aliento, dolor en el pecho, hinchazn de la  pierna -seales de Chartered loss adjusterun embarazo -entumecimiento o debilidad repentina de la cara, brazo o pierna -dificultad para caminar, mareos, prdida de equilibrio o coordinacin -sangrado o flujo vaginal inusual -cansancio o debilidad inusual -color amarillento de los ojos o  la piel  Efectos secundarios que, por lo general, no requieren atencin mdica (debe informarlos a su mdico o a Producer, television/film/video de la salud si persisten o si son molestos): -acn -dolor de pecho -cambios de peso -tos -fiebre o escalofros -dolor de cabeza -sangrado menstrual irregular -picazn, ardo o flujo vaginal -dolor o dificultad para Geographical information systems officer -dolor de garganta Puede ser que esta lista no menciona todos los posibles efectos secundarios. Comunquese a su mdico por asesoramiento mdico [employer]. Usted puede informar los efectos secundarios a la FDA por telfono al 1-800-FDA-1088. Dnde debo guardar mi medicina? Este medicamento se administra en hospitales o clnicas y no necesitar guardarlo en su domicilio. ATENCIN: Este folleto es un resumen. Puede ser que no cubra toda la posible informacin. Si usted tiene preguntas acerca de esta medicina, consulte con su mdico, su farmacutico o su profesional de Radiographer, therapeutic.  2014, Elsevier/Gold Standard. (2011-09-27 18:26:08) Madilyn Fireman contra el ttanos y la difteria (Td), Lo que debe saber  (Tetanus, Diphtheria (Td) Vaccine, What You Need to Know) PORQU VACUNARSE?  El ttanos  y la difteria son enfermedades muy graves. Actualmente son raras en los [place], pero las personas que se infectan suelen tener complicaciones graves. La vacuna Td se utiliza para proteccin contra estas enfermedades en adolescentes y adultos. Tanto el ttanos como la difteria son infecciones causadas por bacterias. La difteria se transmite de persona a persona a travs de la tos o el estornudo. El ttanos ingresa al organismo a travs de cortes, rasguos o heridas.  (Trismo) provoca  la contraccin dolorosa de los msculos, por lo general, en todo el cuerpo.   Puede causar el endurecimiento de los msculos de la cabeza y el cuello, de modo que impide abrir la boca, tragar y en algunos casos, Industrial/product designer. El ttanos causa la muerte de 1 de cada 5 personas que se infectan. La DIFTERIA hace que se forme una sustancia espesa en el fondo de la garganta.   Puede causar problemas respiratorios, parlisis, insuficiencia cardaca e incluso la muerte. Antes de las vacunas, en los [place] se vieron ms de 200.000 casos al ao de difteria y cientos de casos de ttanos. Desde que comenz la vacunacin, el nmero de casos en ambas enfermedades ha disminuido en un 99%.  VACUNA TD  La vacuna Tdap protege a adolescentes y adultos contra el ttanos y la difteria. La Td generalmente se Production designer, theatre/television/film refuerzo cada 10 aos pero tambin puede aplicarse antes luego de sufrir una herida o [place] sucia y grave.  El mdico le dar ms informacin.  La Td puede administrarse de manera segura simultneamente con otras vacunas.  ALGUNAS PERSONAS NO DEBEN RECIBIR ESTA VACUNA  Si alguna vez tuvo una reaccin alrgica potencialmente mortal despus de Neomia Dear dosis de la vacuna contra el ttanos, la difteria o la tos [place], o tuvo una alergia grave a cualquiera de los componentes de esta vacuna, no debe aplicarse la vacuna. Informe a su mdico si usted sufre algn tipo de alergia grave.  Consulte con su mdico si:  tiene epilepsia u otra enfermedad del sistema nervioso,  siente dolor intenso o se hincha despus de recibir cualquier vacuna contra la difteria, el ttanos o la tos [place],  ha tenido el sndrome de Scientific laboratory technician (GBS por sus siglas en ingls).  no se siente Research scientist (life sciences) en que se ha programado la vacuna. RIESGOS DE UNA REACCIN A LA VACUNA Con la vacuna, como cualquier medicamento, existe la posibilidad de sufrir [place]  secundarios. Suelen ser leves y desaparecen por s solos.  Los  efectos secundarios graves son [place], pero son Lynnae Sandhoff raros.  La [employer]son de las personas a los que se aplica esta vacuna no tienen ningn problema.  Problemas leves  luego de aplicarse la Td  (no han interferido con las actividades)   Art therapist de la inyeccin (8 de cada 10 personas).  Enrojecimiento o hinchazn en el lugar de la inyeccin (1 de cada 3 personas).  Dolor de Training and development officer (alrededor de 1 cada 15 personas).  Dolor de cabeza o cansancio (poco frecuente). Problemas moderados  luego de aplicarse la Td  (han interferido con las [place], pero no requieren atencin mdica)   La temperatura es de ms de 102 F (38.9 C). Problemas graves  luego de aplicarse la Td  (no puede realizar las actividades habituales, requiere atencin mdica)   Inflamacin, Engineer, mining intenso, sangrado o irritacin en el brazo, en el sitio de la inyeccin (poco frecuente). Problemas que podran ocurrir despus de cualquier vacuna:   Despus de cualquier procedimiento mdico pueden ocurrir AG[employer] Corporation, incluso despus de aplicarse una vacuna. Para prevenir desmayos y lesiones causadas por la cada puede sentarse o recostarse por unos 15 minutos. Informe a su mdico si se siente mareado, tiene cambios en la visin o zumbidos en los odos.  En raras ocasiones puede haber dolor intenso en el hombro y limitacin de la amplitud de movimientos del brazo en el que le aplicaron la vacunas.  Las Therapist, art graves a la vacuna son Lynnae Sandhoff raras y se estima en menos de 1 en un milln de dosis. Si ocurriera, sera dentro de unos pocos minutos o unas pocas horas de haberse vacunado. QU PASA SI HAY UNA REACCIN GRAVE?  Qu signos debo buscar?  Observe todo lo que le preocupe, como signos de una reaccin alrgica grave, fiebre muy alta o cambios en el comportamiento. Los signos de Runner, broadcasting/film/video grave pueden incluir urticaria, hinchazn de la cara y la garganta, dificultad para respirar,  ritmo cardaco acelerado, mareos y debilidad. Generalmente comienzan entre unos pocos minutos y algunas horas despus de la vacunacin.  Qu debo hacer?  Si usted piensa que se trata de una reaccin alrgica grave o de otra emergencia que no puede esperar, llame al 911 o llvelo al hospital ms cercano. De lo contrario, llame a su mdico.  Despus, la reaccin debe informarse a la "Vaccine Adverse Event Reporting System" Sistema de informacin sobre efectos adversos de las vacunas (VAERS). Su mdico puede presentar este informe, o puede hacerlo usted mismo a travs del sitio web de VAERS o llamando al [phone]. El VAERS es slo para Biomedical engineer. No brindan consejo mdico. PROGRAMA NACIONAL DE COMPENSACIN DE DAOS POR VACUNAS  El National Vaccine Injury Compensation Program (VICP) es un programa federal que fue creado para compensar a las personas que puedan haber sufrido daos al recibir ciertas vacunas.  Aquellas personas que consideren que han sufrido un dao como consecuencia de una vacuna y quieren saber ms acerca del programa y como presentar [place], [place] llamar al [phone] o visitar su sitio web del VICP.  CMO PUEDO OBTENER MS INFORMACIN?   Consulte a su mdico.  Pngase en contacto con el servicio de salud de su localidad o su estado.  Comunquese con los Centros para el control y la prevencin de Child psychotherapist for Disease Control and Prevention, CDC).  Llame al [phone] (1-800-CDC-INFO)  Visite el sitio web del CDC. CDC  Inactivated Influenza Vaccine Interim VIS (04/08/12)  Document Released: 06/07/2008 Document Revised: 06/16/2012 ExitCare Patient Information 2014 [place]ExitCare, [place]LL[place].

## 2013-06-12 NOTE — Addendum Note (Signed)
Addended by: Bertram SavinGONZALEZ-Annmargaret Decaprio A on: 06/12/2013 12:12 PM   Modules accepted: Orders

## 2013-06-13 LAB — COMPREHENSIVE METABOLIC PANEL
ALK PHOS: 111 U/L (ref 39–117)
ALT: 17 U/L (ref 0–35)
AST: 17 U/L (ref 0–37)
Albumin: 3.9 g/dL (ref 3.5–5.2)
BILIRUBIN TOTAL: 0.4 mg/dL (ref 0.2–1.2)
BUN: 10 mg/dL (ref 6–23)
CALCIUM: 8.9 mg/dL (ref 8.4–10.5)
CO2: 24 mEq/L (ref 19–32)
CREATININE: 0.62 mg/dL (ref 0.50–1.10)
Chloride: 103 mEq/L (ref 96–112)
Glucose, Bld: 84 mg/dL (ref 70–99)
Potassium: 3.9 mEq/L (ref 3.5–5.3)
Sodium: 136 mEq/L (ref 135–145)
Total Protein: 7.3 g/dL (ref 6.0–8.3)

## 2013-06-13 LAB — LIPID PANEL
CHOL/HDL RATIO: 3.6 ratio
Cholesterol: 137 mg/dL (ref 0–200)
HDL: 38 mg/dL — AB (ref >39)
LDL CALC: 78 mg/dL (ref 0–99)
Triglycerides: 106 mg/dL (ref <150)
VLDL: 21 mg/dL (ref 0–40)

## 2013-06-13 LAB — URINALYSIS W MICROSCOPIC + REFLEX CULTURE
BACTERIA UA: NONE SEEN
Bilirubin Urine: NEGATIVE
CASTS: NONE SEEN
Crystals: NONE SEEN
Glucose, UA: NEGATIVE mg/dL
HGB URINE DIPSTICK: NEGATIVE
KETONES UR: NEGATIVE mg/dL
Leukocytes, UA: NEGATIVE
NITRITE: NEGATIVE
PH: 6 (ref 5.0–8.0)
Protein, ur: NEGATIVE mg/dL
Specific Gravity, Urine: 1.019 (ref 1.005–1.030)
Squamous Epithelial / LPF: NONE SEEN
Urobilinogen, UA: 0.2 mg/dL (ref 0.0–1.0)

## 2013-06-13 LAB — TSH: TSH: 2.347 u[IU]/mL (ref 0.350–4.500)

## 2013-06-15 ENCOUNTER — Telehealth: Payer: Self-pay | Admitting: Gynecology

## 2013-06-15 ENCOUNTER — Other Ambulatory Visit: Payer: Self-pay | Admitting: Gynecology

## 2013-06-15 DIAGNOSIS — Z3046 Encounter for surveillance of implantable subdermal contraceptive: Secondary | ICD-10-CM

## 2013-06-15 NOTE — Telephone Encounter (Signed)
06/15/13-LM VM that pt ins covers the Nexplanon at 100% for contraception. She was advised to call while on cycle for insertion. [facility]

## 2013-06-23 ENCOUNTER — Telehealth: Payer: Self-pay | Admitting: *Deleted

## 2013-06-23 MED ORDER — FLUCONAZOLE 150 MG PO TABS: 150.0000 mg | ORAL_TABLET | Freq: Once | ORAL | Status: DC

## 2013-06-23 MED ORDER — METRONIDAZOLE 0.75 % VA GEL: 1.0000 | Freq: Every day | VAGINAL | Status: DC

## 2013-06-23 NOTE — Telephone Encounter (Signed)
Pt called stating Rx from 06/12/13 was never sent to pharmacy Diflucan 150 mg one by mouth today & MetroGel vaginal cream each bedtime for 7 days. rx will be sent

## 2014-01-04 ENCOUNTER — Encounter: Payer: Self-pay | Admitting: Gynecology

## 2014-07-22 ENCOUNTER — Ambulatory Visit (INDEPENDENT_AMBULATORY_CARE_PROVIDER_SITE_OTHER): Payer: 59 | Admitting: Gynecology

## 2014-07-22 ENCOUNTER — Encounter: Payer: Self-pay | Admitting: Gynecology

## 2014-07-22 VITALS — BP 116/70 | Ht 62.5 in | Wt 180.0 lb

## 2014-07-22 DIAGNOSIS — B3731 Acute candidiasis of vulva and vagina: Secondary | ICD-10-CM

## 2014-07-22 DIAGNOSIS — Z113 Encounter for screening for infections with a predominantly sexual mode of transmission: Secondary | ICD-10-CM | POA: Diagnosis not present

## 2014-07-22 DIAGNOSIS — B373 Candidiasis of vulva and vagina: Secondary | ICD-10-CM

## 2014-07-22 DIAGNOSIS — N898 Other specified noninflammatory disorders of vagina: Secondary | ICD-10-CM

## 2014-07-22 DIAGNOSIS — Z01419 Encounter for gynecological examination (general) (routine) without abnormal findings: Secondary | ICD-10-CM

## 2014-07-22 LAB — CBC WITH DIFFERENTIAL/PLATELET
BASOS PCT: 0 % (ref 0–1)
Basophils Absolute: 0 10*3/uL (ref 0.0–0.1)
EOS ABS: 0.1 10*3/uL (ref 0.0–0.7)
EOS PCT: 1 % (ref 0–5)
HCT: 40.4 % (ref 36.0–46.0)
Hemoglobin: 13.3 g/dL (ref 12.0–15.0)
LYMPHS PCT: 30 % (ref 12–46)
Lymphs Abs: 2.2 10*3/uL (ref 0.7–4.0)
MCH: 27.5 pg (ref 26.0–34.0)
MCHC: 32.9 g/dL (ref 30.0–36.0)
MCV: 83.5 fL (ref 78.0–100.0)
MPV: 9 fL (ref 8.6–12.4)
Monocytes Absolute: 0.4 10*3/uL (ref 0.1–1.0)
Monocytes Relative: 5 % (ref 3–12)
Neutro Abs: 4.7 10*3/uL (ref 1.7–7.7)
Neutrophils Relative %: 64 % (ref 43–77)
Platelets: 322 10*3/uL (ref 150–400)
RBC: 4.84 MIL/uL (ref 3.87–5.11)
RDW: 15.1 % (ref 11.5–15.5)
WBC: 7.4 10*3/uL (ref 4.0–10.5)

## 2014-07-22 LAB — LIPID PANEL
CHOLESTEROL: 139 mg/dL (ref 0–200)
HDL: 41 mg/dL — AB (ref >=46)
LDL Cholesterol: 81 mg/dL (ref 0–99)
Total CHOL/HDL Ratio: 3.4 Ratio
Triglycerides: 87 mg/dL (ref <150)
VLDL: 17 mg/dL (ref 0–40)

## 2014-07-22 LAB — COMPREHENSIVE METABOLIC PANEL
ALT: 20 U/L (ref 0–35)
AST: 18 U/L (ref 0–37)
Albumin: 3.9 g/dL (ref 3.5–5.2)
Alkaline Phosphatase: 110 U/L (ref 39–117)
BILIRUBIN TOTAL: 0.5 mg/dL (ref 0.2–1.2)
BUN: 7 mg/dL (ref 6–23)
CALCIUM: 9 mg/dL (ref 8.4–10.5)
CHLORIDE: 103 meq/L (ref 96–112)
CO2: 25 mEq/L (ref 19–32)
CREATININE: 0.56 mg/dL (ref 0.50–1.10)
Glucose, Bld: 81 mg/dL (ref 70–99)
Potassium: 4.3 mEq/L (ref 3.5–5.3)
Sodium: 138 mEq/L (ref 135–145)
Total Protein: 7.3 g/dL (ref 6.0–8.3)

## 2014-07-22 LAB — WET PREP FOR TRICH, YEAST, CLUE
Clue Cells Wet Prep HPF POC: NONE SEEN
TRICH WET PREP: NONE SEEN

## 2014-07-22 LAB — TSH: TSH: 1.294 u[IU]/mL (ref 0.350–4.500)

## 2014-07-22 MED ORDER — FLUCONAZOLE 150 MG PO TABS: 150.0000 mg | ORAL_TABLET | Freq: Once | ORAL | Status: DC

## 2014-07-22 NOTE — Progress Notes (Signed)
[facility] Oct 25, 1980 [id number]   History:    34 y.o.  for annual gyn exam with the only complaining of occasional discharge. Patient in a monogamous relationship. Has been using condoms for contraception 1 discussed different alternative forms of contraception. Patient with no past history of abnormal Pap smears.  Past medical history,surgical history, family history and social history were all reviewed and documented in the EPIC chart.  Gynecologic History Patient's last menstrual period was 07/01/2014. Contraception: condoms Last Pap: 2015. Results were: normal Last mammogram: Not indicated. Results were: Not indicated  Obstetric History OB History  Gravida Para Term Preterm AB SAB TAB Ectopic Multiple Living  2 2 2       2     # Outcome Date GA Lbr Len/2nd Weight Sex Delivery Anes PTL Lv  2 Term     F CS-Unspec  N Y  1 Term     M Vag-Spont  N Y       ROS: A ROS was performed and pertinent positives and negatives are included in the history.  GENERAL: No fevers or chills. HEENT: No change in vision, no earache, sore throat or sinus congestion. NECK: No pain or stiffness. CARDIOVASCULAR: No chest pain or pressure. No palpitations. PULMONARY: No shortness of breath, cough or wheeze. GASTROINTESTINAL: No abdominal pain, nausea, vomiting or diarrhea, melena or bright red blood per rectum. GENITOURINARY: No urinary frequency, urgency, hesitancy or dysuria. MUSCULOSKELETAL: No joint or muscle pain, no back pain, no recent trauma. DERMATOLOGIC: No rash, no itching, no lesions. ENDOCRINE: No polyuria, polydipsia, no heat or cold intolerance. No recent change in weight. HEMATOLOGICAL: No anemia or easy bruising or bleeding. NEUROLOGIC: No headache, seizures, numbness, tingling or weakness. PSYCHIATRIC: No depression, no loss of interest in normal activity or change in sleep pattern.     Exam: chaperone present  BP 116/70 mmHg  Ht 5' 2.5" (1.588 m)  Wt 180 lb (81.647 kg)  BMI  32.38 kg/m2  LMP 07/01/2014  Body mass index is 32.38 kg/(m^2).  General appearance : Well developed well nourished female. No acute distress HEENT: Eyes: no retinal hemorrhage or exudates,  Neck supple, trachea midline, no carotid bruits, no thyroidmegaly Lungs: Clear to auscultation, no rhonchi or wheezes, or rib retractions  Heart: Regular rate and rhythm, no murmurs or gallops Breast:Examined in sitting and supine position were symmetrical in appearance, no palpable masses or tenderness,  no skin retraction, no nipple inversion, no nipple discharge, no skin discoloration, no axillary or supraclavicular lymphadenopathy Abdomen: no palpable masses or tenderness, no rebound or guarding Extremities: no edema or skin discoloration or tenderness  Pelvic:  Bartholin, Urethra, Skene Glands: Within normal limits             Vagina: No gross lesions or discharge  Cervix: No gross lesions or discharge  Uterus  anteverted, normal size, shape and consistency, non-tender and mobile  Adnexa  Without masses or tenderness  Anus and perineum  normal   Rectovaginal  normal sphincter tone without palpated masses or tenderness             Hemoccult not indicated   Wet prep few yeast were noted  Assessment/Plan:  34 y.o. female for annual exam who is interested in the ParaGard T380A IUD for which literature information was provided. She will contact the office and schedule [place]. Her Pap smear was not done today in accordance to the new guidelines. The following screening labs were ordered today: Comprehensive metabolic panel, fasting  lipid profile, TSH, CBC, and urinalysis. For her yeast infection she was called and present for Diflucan 150 mW 1 by mouth today. GC and Chlamydia culture pending at time of this dictation.   Ok EdwardsFERNANDEZ,JUAN H MD, 9:56 AM 07/22/2014

## 2014-07-22 NOTE — Patient Instructions (Addendum)
Informacin sobre el dispositivo intrauterino (Intrauterine Device Information) Un dispositivo intrauterino (DIU) se inserta en el tero e impide el embarazo. Hay dos tipos de DIU:   DIU de cobre: este tipo de DIU est recubierto con un alambre de cobre y se inserta dentro del tero. El cobre hace que el tero y las trompas de Falopio produzcan un liquido que Federated Department Storesdestruye los espermatozoides. El DIU de cobre puede Geneticist, molecularpermanecer en el lugar durante 10 aos.  DIU con hormona: este tipo de DIU contiene la hormona progestina (progesterona sinttica). Las hormonas hacen que el moco cervical se haga ms espeso, lo que evita que el esperma ingrese al tero. Tambin hace que la membrana que recubre internamente al tero sea ms delgada lo que impide el implante del vulo fertilizado. La hormona debilita o destruye los espermatozoides que ingresan al tero. Alguno de los tipos de DIU hormonal pueden Geneticist, molecularpermanecer en el lugar durante 5 aos y otros tipos pueden dejarse en el lugar por 3 aos. El mdico se asegurar de que usted sea una buena candidata para usar el DIU. Converse con su mdico acerca de los posibles efectos secundarios.  VENTAJAS DEL DISPOSITIVO INTRAUTERINO  El DIU es muy eficaz, reversible, de accin prolongada y de bajo mantenimiento.  No hay efectos secundarios relacionados con el estrgeno.  El DIU puede ser utilizado durante la Market researcherlactancia.  No est asociado con el aumento de [place].  Funciona inmediatamente despus de la insercin.  El DIU hormonal funciona inmediatamente si se inserta dentro de los [address] das del inicio del perodo. Ser necesario que utilice un mtodo anticonceptivo adicional durante 7 das si el DIU hormonal se inserta en algn otro momento del ciclo.  El DIU de cobre no interfiere con las hormonas femeninas.  El DIU hormonal puede hacer que los perodos menstruales abundantes se hagan ms ligeros y que haya menos clicos.  El DIU hormonal puede usarse durante 3 a 5  aos.  El DIU de cobre puede usarse durante 10 aos. DESVENTAJAS DEL DISPOSITIVO INTRAUTERINO  El DIU hormonal puede estar asociado con patrones de sangrado irregular.  El DIU de cobre puede hacer que el flujo menstrual ms abundante y doloroso.  Puede experimentar clicos y sangrado vaginal despus de la insercin. Document Released: 08/09/2009 Document Revised: 10/22/2012 [facility] CenterExit[facility] Patient Information 2015 [place]ExitCare, [place]LLC. This information is not intended to replace advice given to you by your health care provider. Make sure you discuss any questions you have with your health care provider. Vaginitis monilisica (Monilial Vaginitis) La vaginitis es una inflamacin (irritacin, hinchazn) de la vagina y la vulva. Esta no es una enfermedad de transmisin sexual.  CAUSAS Este tipo de vaginitis lo causa un hongo (candida) que normalmente se encuentra en la vagina. El hongo candida se ha desarrollado hasta el punto de ocasionar problemas en el equilibrio qumico. SNTOMAS  Secrecin vaginal espesa y blanca.  Hinchazn, picazn, enrojecimiento e inflamacin de la vagina y en algunos casos de los labios vaginales (vulva).  Ardor o dolor al ConocoPhillipsorinar.  Dolor en las relaciones sexuales. DIAGNSTICO Los factores que favorecen la vaginitis moniliasica son:  Everlean Pattersontapas de virginidad y postmenopusicas.  Embarazo.  Infecciones.  Sentir cansancio, estar enferma o estresada, especialmente si ya ha sufrido este problema en el pasado.  Diabetes Buen control ayudar a disminur la probabilidad.  Pldoras anticonceptivas  Ropa interior [place] Islandsmuy ajustada.  El uso de espumas de bao, aerosoles femeninos duchas vaginales o tampones con desodorante.  Algunos antibiticos (medicamentos que destruyen grmenes).  Si contrae alguna  enfermedad puede sufrir recurrencias espordicas. TRATAMIENTO El profesional que lo asiste prescribir medicamentos.  Hay diferentes tipos de cremas y supositorios vaginales  que tratan especficamente la vaginitis monilisica. Para infecciones por hongos recurrentes, utilice un supositorio o crema en la vagina dos veces por semana, o segn se le indique.  Tambin podrn utilizarse cremas con corticoides o anti monilisicas para la picazn o la irritacin de la vulva. Consulte con el profesional que la asiste.  Si la crema no da resultado, podr aplicarse en la vagina una solucin con azul de metileno.  El consumo de yogur puede prevenir este tipo de vaginitis. INSTRUCCIONES PARA EL CUIDADO DOMICILIARIO  Tome todos los medicamentos tal como se le indic.  No mantenga relaciones sexuales hasta que el tratamiento se haya completado, o segn las indicaciones del profesional que la asiste.  Tome baos de asiento tibios.  No se aplique duchas vaginales.  No utilice tampones, especialmente los perfumados.  Use ropa interior de algodn  MirantEvite los pantalones ajustados y las medias tipo panty.  Comunique a sus compaeros sexuales que sufre una infeccin por hongos. Ellos deben concurrir para un control mdico si tienen sntomas como una urticaria leve o picazn.  Sus compaeros sexuales deben tratarse tambin si la infeccin es difcil de Pharmacologisteliminar.  Practique el sexo seguro - use condones  Algunos medicamentos vaginales ocasionan fallas en los condones de ltex. Los medicamentos vaginales que pueden daar los condones son:  ChiropodistCrema cleocina  Butoconazole (Femstat)  Terconazole (Terazol) supositorios vaginales  Miconazole (Monistat) (es un medicamento de venta libre) SOLICITE ATENCIN MDICA SI:  Daphane ShepherdUsted tiene una temperatura oral de ms de 38,9 C (102 F).  Si la infeccin empeora luego de 2 [address]das de tratamiento.  Si la infeccin no mejora luego de 3 [address]das de tratamiento.  Aparecen ampollas en o alrededor de la vagina.  Si aparece una hemorragia vaginal y no es el momento del perodo.  Siente dolor al ConocoPhillipsorinar.  Presenta problemas  intestinales.  Tiene dolor durante las The St. Paul Travelersrelaciones sexuales. Document Released: 11/29/2004 Document Revised: 05/14/2011 [facility] CenterExitCare Patient Information 2015 [place] JohnExitCare, MarylandLLC. This information is not intended to replace advice given to you by your health care provider. Make sure you discuss any questions you have with your health care provider.

## 2014-07-23 LAB — URINALYSIS W MICROSCOPIC + REFLEX CULTURE
Bacteria, UA: NONE SEEN
Bilirubin Urine: NEGATIVE
Casts: NONE SEEN
Crystals: NONE SEEN
Glucose, UA: NEGATIVE mg/dL
HGB URINE DIPSTICK: NEGATIVE
Ketones, ur: NEGATIVE mg/dL
LEUKOCYTES UA: NEGATIVE
Nitrite: NEGATIVE
PH: 7 (ref 5.0–8.0)
PROTEIN: NEGATIVE mg/dL
Specific Gravity, Urine: 1.016 (ref 1.005–1.030)
UROBILINOGEN UA: 0.2 mg/dL (ref 0.0–1.0)

## 2014-07-24 LAB — GC/CHLAMYDIA PROBE AMP
CT Probe RNA: NEGATIVE
GC Probe RNA: NEGATIVE

## 2015-08-05 ENCOUNTER — Encounter: Payer: 59 | Admitting: Gynecology

## 2015-08-16 ENCOUNTER — Ambulatory Visit (INDEPENDENT_AMBULATORY_CARE_PROVIDER_SITE_OTHER): Payer: Commercial Managed Care - HMO | Admitting: Gynecology

## 2015-08-16 ENCOUNTER — Encounter: Payer: Self-pay | Admitting: Gynecology

## 2015-08-16 ENCOUNTER — Other Ambulatory Visit: Payer: Self-pay | Admitting: Gynecology

## 2015-08-16 VITALS — BP 110/72 | Ht 63.0 in | Wt 179.6 lb

## 2015-08-16 DIAGNOSIS — Z01419 Encounter for gynecological examination (general) (routine) without abnormal findings: Secondary | ICD-10-CM | POA: Diagnosis not present

## 2015-08-16 LAB — COMPREHENSIVE METABOLIC PANEL
ALT: 17 U/L (ref 6–29)
AST: 18 U/L (ref 10–30)
Albumin: 3.8 g/dL (ref 3.6–5.1)
Alkaline Phosphatase: 120 U/L — ABNORMAL HIGH (ref 33–115)
BILIRUBIN TOTAL: 0.5 mg/dL (ref 0.2–1.2)
BUN: 9 mg/dL (ref 7–25)
CO2: 26 mmol/L (ref 20–31)
Calcium: 8.8 mg/dL (ref 8.6–10.2)
Chloride: 104 mmol/L (ref 98–110)
Creat: 0.68 mg/dL (ref 0.50–1.10)
GLUCOSE: 82 mg/dL (ref 65–99)
Potassium: 3.9 mmol/L (ref 3.5–5.3)
SODIUM: 138 mmol/L (ref 135–146)
Total Protein: 7.3 g/dL (ref 6.1–8.1)

## 2015-08-16 LAB — CBC WITH DIFFERENTIAL/PLATELET
BASOS ABS: 0 {cells}/uL (ref 0–200)
Basophils Relative: 0 %
EOS ABS: 76 {cells}/uL (ref 15–500)
Eosinophils Relative: 1 %
HCT: 39.9 % (ref 35.0–45.0)
Hemoglobin: 13.1 g/dL (ref 11.7–15.5)
LYMPHS PCT: 27 %
Lymphs Abs: 2052 cells/uL (ref 850–3900)
MCH: 26.6 pg — AB (ref 27.0–33.0)
MCHC: 32.8 g/dL (ref 32.0–36.0)
MCV: 81.1 fL (ref 80.0–100.0)
MONOS PCT: 5 %
MPV: 8.8 fL (ref 7.5–12.5)
Monocytes Absolute: 380 cells/uL (ref 200–950)
NEUTROS PCT: 67 %
Neutro Abs: 5092 cells/uL (ref 1500–7800)
PLATELETS: 348 10*3/uL (ref 140–400)
RBC: 4.92 MIL/uL (ref 3.80–5.10)
RDW: 15.7 % — AB (ref 11.0–15.0)
WBC: 7.6 10*3/uL (ref 3.8–10.8)

## 2015-08-16 LAB — LIPID PANEL
Cholesterol: 147 mg/dL (ref 125–200)
HDL: 39 mg/dL — AB (ref >=46)
LDL CALC: 72 mg/dL (ref <130)
TRIGLYCERIDES: 180 mg/dL — AB (ref <150)
Total CHOL/HDL Ratio: 3.8 Ratio (ref <=5.0)
VLDL: 36 mg/dL — AB (ref <30)

## 2015-08-16 LAB — TSH: TSH: 1.73 mIU/L

## 2015-08-16 NOTE — Progress Notes (Signed)
Amber Fuentes Jan 24, 1981 [id number]   History:    35 y.o.  for annual gyn exam with no complaints today. Patient using condoms for contraception. Patient reports normal menstrual cycles. Patient with no past history of any abnormal Pap smears  Past medical history,surgical history, family history and social history were all reviewed and documented in the EPIC chart.  Gynecologic History Patient's last menstrual period was 08/05/2015. Contraception: condoms Last Pap: 2015. Results were: normal Last mammogram: Not indicated. Results were: Not indicated  Obstetric History OB History  Gravida Para Term Preterm AB SAB TAB Ectopic Multiple Living  2 2 2       2     # Outcome Date GA Lbr Len/2nd Weight Sex Delivery Anes PTL Lv  2 Term     F CS-Unspec  N Y  1 Term     M Vag-Spont  N Y       ROS: A ROS was performed and pertinent positives and negatives are included in the history.  GENERAL: No fevers or chills. HEENT: No change in vision, no earache, sore throat or sinus congestion. NECK: No pain or stiffness. CARDIOVASCULAR: No chest pain or pressure. No palpitations. PULMONARY: No shortness of breath, cough or wheeze. GASTROINTESTINAL: No abdominal pain, nausea, vomiting or diarrhea, melena or bright red blood per rectum. GENITOURINARY: No urinary frequency, urgency, hesitancy or dysuria. MUSCULOSKELETAL: No joint or muscle pain, no back pain, no recent trauma. DERMATOLOGIC: No rash, no itching, no lesions. ENDOCRINE: No polyuria, polydipsia, no heat or cold intolerance. No recent change in weight. HEMATOLOGICAL: No anemia or easy bruising or bleeding. NEUROLOGIC: No headache, seizures, numbness, tingling or weakness. PSYCHIATRIC: No depression, no loss of interest in normal activity or change in sleep pattern.     Exam: chaperone present  BP 110/72 mmHg  Ht 5\' 3"  (1.6 m)  Wt 179 lb 9.6 oz (81.466 kg)  BMI 31.82 kg/m2  LMP 08/05/2015  Body mass index is 31.82  kg/(m^2).  General appearance : Well developed well nourished female. No acute distress HEENT: Eyes: no retinal hemorrhage or exudates,  Neck supple, trachea midline, no carotid bruits, no thyroidmegaly Lungs: Clear to auscultation, no rhonchi or wheezes, or rib retractions  Heart: Regular rate and rhythm, no murmurs or gallops Breast:Examined in sitting and supine position were symmetrical in appearance, no palpable masses or tenderness,  no skin retraction, no nipple inversion, no nipple discharge, no skin discoloration, no axillary or supraclavicular lymphadenopathy Abdomen: no palpable masses or tenderness, no rebound or guarding Extremities: no edema or skin discoloration or tenderness  Pelvic:  Bartholin, Urethra, Skene Glands: Within normal limits             Vagina: No gross lesions or discharge  Cervix: No gross lesions or discharge  Uterus  anteverted, normal size, shape and consistency, non-tender and mobile  Adnexa  Without masses or tenderness  Anus and perineum  normal   Rectovaginal  normal sphincter tone without palpated masses or tenderness             Hemoccult not indicated     Assessment/Plan:  35 y.o. female for annual exam will have the following screening blood work in a fasting state today: Fasting lipid profile, comprehensive metabolic panel, TSH, CBC, and urinalysis. Pap smear not done today she will need one next year according to the new guidelines. Patient was instructed on the importance of monthly breast exams. Patient not interested in any other form of contraception we'll continue  to use condoms.   Ok Edwards MD, 10:44 AM 08/16/2015

## 2015-08-17 LAB — URINALYSIS W MICROSCOPIC + REFLEX CULTURE
Bilirubin Urine: NEGATIVE
CASTS: NONE SEEN [LPF]
CRYSTALS: NONE SEEN [HPF]
Glucose, UA: NEGATIVE
Hgb urine dipstick: NEGATIVE
KETONES UR: NEGATIVE
Nitrite: NEGATIVE
PROTEIN: NEGATIVE
SPECIFIC GRAVITY, URINE: 1.025 (ref 1.001–1.035)
Yeast: NONE SEEN [HPF]
pH: 6 (ref 5.0–8.0)

## 2015-08-18 LAB — HEPATITIS PANEL, ACUTE
HCV AB: NEGATIVE
HEP B S AG: NEGATIVE
Hep A IgM: NONREACTIVE
Hep B C IgM: NONREACTIVE

## 2015-08-18 LAB — URINE CULTURE
Colony Count: NO GROWTH
ORGANISM ID, BACTERIA: NO GROWTH

## 2015-08-23 ENCOUNTER — Other Ambulatory Visit: Payer: Self-pay | Admitting: Gynecology

## 2015-08-23 DIAGNOSIS — R748 Abnormal levels of other serum enzymes: Secondary | ICD-10-CM

## 2015-08-25 ENCOUNTER — Other Ambulatory Visit: Payer: Commercial Managed Care - HMO

## 2015-08-25 ENCOUNTER — Other Ambulatory Visit: Payer: Self-pay | Admitting: Gynecology

## 2015-08-25 DIAGNOSIS — R748 Abnormal levels of other serum enzymes: Secondary | ICD-10-CM

## 2015-08-25 LAB — HEPATIC FUNCTION PANEL
ALT: 18 U/L (ref 6–29)
AST: 15 U/L (ref 10–30)
Albumin: 3.7 g/dL (ref 3.6–5.1)
Alkaline Phosphatase: 103 U/L (ref 33–115)
BILIRUBIN DIRECT: 0.1 mg/dL (ref <=0.2)
BILIRUBIN TOTAL: 0.4 mg/dL (ref 0.2–1.2)
Indirect Bilirubin: 0.3 mg/dL (ref 0.2–1.2)
Total Protein: 7 g/dL (ref 6.1–8.1)

## 2015-08-25 LAB — ALKALINE PHOSPHATASE: ALK PHOS: 110 U/L (ref 33–115)

## 2016-07-18 ENCOUNTER — Encounter: Payer: Self-pay | Admitting: Gynecology

## 2016-08-17 ENCOUNTER — Ambulatory Visit (INDEPENDENT_AMBULATORY_CARE_PROVIDER_SITE_OTHER): Payer: 59 | Admitting: Gynecology

## 2016-08-17 ENCOUNTER — Encounter: Payer: Self-pay | Admitting: Gynecology

## 2016-08-17 VITALS — BP 122/78 | Ht 63.0 in | Wt 182.0 lb

## 2016-08-17 DIAGNOSIS — Z01419 Encounter for gynecological examination (general) (routine) without abnormal findings: Secondary | ICD-10-CM

## 2016-08-17 LAB — COMPREHENSIVE METABOLIC PANEL
ALK PHOS: 123 U/L — AB (ref 33–115)
ALT: 16 U/L (ref 6–29)
AST: 14 U/L (ref 10–30)
Albumin: 3.8 g/dL (ref 3.6–5.1)
BILIRUBIN TOTAL: 0.3 mg/dL (ref 0.2–1.2)
BUN: 7 mg/dL (ref 7–25)
CO2: 24 mmol/L (ref 20–31)
CREATININE: 0.65 mg/dL (ref 0.50–1.10)
Calcium: 8.9 mg/dL (ref 8.6–10.2)
Chloride: 104 mmol/L (ref 98–110)
GLUCOSE: 87 mg/dL (ref 65–99)
Potassium: 4.2 mmol/L (ref 3.5–5.3)
SODIUM: 137 mmol/L (ref 135–146)
TOTAL PROTEIN: 7 g/dL (ref 6.1–8.1)

## 2016-08-17 LAB — CBC WITH DIFFERENTIAL/PLATELET
Basophils Absolute: 0 cells/uL (ref 0–200)
Basophils Relative: 0 %
EOS ABS: 79 {cells}/uL (ref 15–500)
EOS PCT: 1 %
HCT: 39.9 % (ref 35.0–45.0)
Hemoglobin: 13.1 g/dL (ref 11.7–15.5)
Lymphocytes Relative: 24 %
Lymphs Abs: 1896 cells/uL (ref 850–3900)
MCH: 27.6 pg (ref 27.0–33.0)
MCHC: 32.8 g/dL (ref 32.0–36.0)
MCV: 84 fL (ref 80.0–100.0)
MONOS PCT: 5 %
MPV: 8.5 fL (ref 7.5–12.5)
Monocytes Absolute: 395 cells/uL (ref 200–950)
Neutro Abs: 5530 cells/uL (ref 1500–7800)
Neutrophils Relative %: 70 %
PLATELETS: 330 10*3/uL (ref 140–400)
RBC: 4.75 MIL/uL (ref 3.80–5.10)
RDW: 14.5 % (ref 11.0–15.0)
WBC: 7.9 10*3/uL (ref 3.8–10.8)

## 2016-08-17 LAB — LIPID PANEL
CHOLESTEROL: 153 mg/dL (ref <200)
HDL: 49 mg/dL — ABNORMAL LOW (ref >50)
LDL Cholesterol: 88 mg/dL (ref <100)
Total CHOL/HDL Ratio: 3.1 Ratio (ref <5.0)
Triglycerides: 82 mg/dL (ref <150)
VLDL: 16 mg/dL (ref <30)

## 2016-08-17 LAB — TSH: TSH: 0.85 m[IU]/L

## 2016-08-17 NOTE — Progress Notes (Signed)
    Amber Fuentes Sep 30, 1980 [id number]   History:    36 y.o.  for annual gyn exam with no complaints today. Patient using condoms for contraception. Patient reports normal menstrual cycles.  Past medical history,surgical history, family history and social history were all reviewed and documented in the EPIC chart.  Gynecologic History Patient's last menstrual period was 08/09/2016 (approximate). Contraception: condoms Last Pap: 2015. Results were: normal Last mammogram: Not indicated. Results were: Not indicated  Obstetric History OB History  Gravida Para Term Preterm AB Living  2 2 2     2   SAB TAB Ectopic Multiple Live Births          2    # Outcome Date GA Lbr Len/2nd Weight Sex Delivery Anes PTL Lv  2 Term     F CS-Unspec  N LIV  1 Term     M Vag-Spont  N LIV       ROS: A ROS was performed and pertinent positives and negatives are included in the history.  GENERAL: No fevers or chills. HEENT: No change in vision, no earache, sore throat or sinus congestion. NECK: No pain or stiffness. CARDIOVASCULAR: No chest pain or pressure. No palpitations. PULMONARY: No shortness of breath, cough or wheeze. GASTROINTESTINAL: No abdominal pain, nausea, vomiting or diarrhea, melena or bright red blood per rectum. GENITOURINARY: No urinary frequency, urgency, hesitancy or dysuria. MUSCULOSKELETAL: No joint or muscle pain, no back pain, no recent trauma. DERMATOLOGIC: No rash, no itching, no lesions. ENDOCRINE: No polyuria, polydipsia, no heat or cold intolerance. No recent change in weight. HEMATOLOGICAL: No anemia or easy bruising or bleeding. NEUROLOGIC: No headache, seizures, numbness, tingling or weakness. PSYCHIATRIC: No depression, no loss of interest in normal activity or change in sleep pattern.     Exam: chaperone present  BP 122/78   Ht 5\' 3"  (1.6 m)   Wt 182 lb (82.6 kg)   LMP 08/09/2016 (Approximate)   BMI 32.24 kg/m   Body mass index is 32.24 kg/m.  General  appearance : Well developed well nourished female. No acute distress HEENT: Eyes: no retinal hemorrhage or exudates,  Neck supple, trachea midline, no carotid bruits, no thyroidmegaly Lungs: Clear to auscultation, no rhonchi or wheezes, or rib retractions  Heart: Regular rate and rhythm, no murmurs or gallops Breast:Examined in sitting and supine position were symmetrical in appearance, no palpable masses or tenderness,  no skin retraction, no nipple inversion, no nipple discharge, no skin discoloration, no axillary or supraclavicular lymphadenopathy Abdomen: no palpable masses or tenderness, no rebound or guarding Extremities: no edema or skin discoloration or tenderness  Pelvic:  Bartholin, Urethra, Skene Glands: Within normal limits             Vagina: No gross lesions or discharge  Cervix: No gross lesions or discharge  Uterus  anteverted, normal size, shape and consistency, non-tender and mobile  Adnexa  Without masses or tenderness  Anus and perineum  normal   Rectovaginal  normal sphincter tone without palpated masses or tenderness             Hemoccult not indicated     Assessment/Plan:  36 y.o. female for annual exam using condoms for contraception reporting normal menstrual cycle. Patient fasting in the following screening blood work will be ordered: Fasting lipid profile, comprehensive metabolic panel, TSH, CBC, and urinalysis. Pap smear was done today.   Ok EdwardsFERNANDEZ,Phylisha Dix H MD, 9:55 AM 08/17/2016

## 2016-08-17 NOTE — Addendum Note (Signed)
Addended by: Kem ParkinsonBARNES, Riann Oman on: 08/17/2016 10:06 AM   Modules accepted: Orders

## 2016-08-18 LAB — URINALYSIS W MICROSCOPIC + REFLEX CULTURE
BACTERIA UA: NONE SEEN [HPF]
BILIRUBIN URINE: NEGATIVE
CRYSTALS: NONE SEEN [HPF]
Casts: NONE SEEN [LPF]
Glucose, UA: NEGATIVE
HGB URINE DIPSTICK: NEGATIVE
KETONES UR: NEGATIVE
Leukocytes, UA: NEGATIVE
Nitrite: NEGATIVE
PROTEIN: NEGATIVE
RBC / HPF: NONE SEEN RBC/HPF (ref <=2)
Specific Gravity, Urine: 1.019 (ref 1.001–1.035)
WBC UA: NONE SEEN WBC/HPF (ref <=5)
Yeast: NONE SEEN [HPF]
pH: 6 (ref 5.0–8.0)

## 2016-08-24 ENCOUNTER — Telehealth: Payer: Self-pay | Admitting: *Deleted

## 2016-08-24 DIAGNOSIS — E7849 Other hyperlipidemia: Secondary | ICD-10-CM

## 2016-08-24 NOTE — Telephone Encounter (Signed)
-----   Message from Ok EdwardsJuan H Fernandez, MD sent at 08/24/2016  2:51 PM EDT ----- Amber DikeJennifer, please contact patient Amber Fuentes(Amber Fuentes may need to call because she was). She was seen today and I started her on treatment for hyperlipidemia and I forgot to mention that she will need to return to the office in 6 months fasting to check her fasting lipid profile and an SGOT and SGPT.

## 2016-08-24 NOTE — Telephone Encounter (Signed)
This is error MD, sent wrong patient for the below. Pt will not be contacted for this.

## 2016-08-24 NOTE — Telephone Encounter (Signed)
Claudia I placed lab orders.

## 2016-09-14 ENCOUNTER — Other Ambulatory Visit: Payer: Self-pay | Admitting: Anesthesiology

## 2016-09-14 DIAGNOSIS — R748 Abnormal levels of other serum enzymes: Secondary | ICD-10-CM

## 2016-09-17 ENCOUNTER — Other Ambulatory Visit: Payer: Self-pay | Admitting: Gynecology

## 2016-09-17 ENCOUNTER — Other Ambulatory Visit: Payer: 59

## 2016-09-17 DIAGNOSIS — R748 Abnormal levels of other serum enzymes: Secondary | ICD-10-CM

## 2016-09-17 LAB — ALKALINE PHOSPHATASE: ALK PHOS: 119 U/L — AB (ref 33–115)

## 2016-09-19 ENCOUNTER — Other Ambulatory Visit: Payer: Self-pay | Admitting: Anesthesiology

## 2016-09-19 DIAGNOSIS — R748 Abnormal levels of other serum enzymes: Secondary | ICD-10-CM

## 2017-03-05 NOTE — L&D Delivery Note (Signed)
VBAC w/o augmentation of any sort. Labored down for 2 hours, pushed for 5 minutes  Delivery Note At 10:28 PM a viable female was delivered via VBAC, Spontaneous (Presentation: LOA  ).  APGAR: 9, 9; weight pending. After 1 minute, the cord was clamped and cut. 40 units of pitocin diluted in 1000cc LR was infused rapidly IV.  The placenta separated spontaneously and delivered via CCT and maternal pushing effort.  It was inspected and appears to be intact with a 3 VC.   Anesthesia:  epidural Episiotomy: None Lacerations:  2nd degree Suture Repair: 2.0 vicryl Est. Blood Loss (mL): 300  Mom to postpartum.  Baby to Couplet care / Skin to Skin.   The delivery and 3rd stage management was performed by Dr. Marthenia RollingScott Bland under my direct supervision and guidance.  Repair done by CNM.  Jacklyn ShellFrances Cresenzo-Dishmon, CNM 10/24/2017, 11:00 PM  .

## 2017-03-21 ENCOUNTER — Other Ambulatory Visit: Payer: Self-pay | Admitting: Anesthesiology

## 2017-03-21 ENCOUNTER — Ambulatory Visit: Payer: 59 | Admitting: Obstetrics & Gynecology

## 2017-03-21 ENCOUNTER — Encounter: Payer: Self-pay | Admitting: Obstetrics & Gynecology

## 2017-03-21 VITALS — BP 128/86 | Wt 190.0 lb

## 2017-03-21 DIAGNOSIS — N912 Amenorrhea, unspecified: Secondary | ICD-10-CM

## 2017-03-21 DIAGNOSIS — Z3491 Encounter for supervision of normal pregnancy, unspecified, first trimester: Secondary | ICD-10-CM

## 2017-03-21 DIAGNOSIS — Z3201 Encounter for pregnancy test, result positive: Secondary | ICD-10-CM | POA: Diagnosis not present

## 2017-03-21 LAB — PREGNANCY, URINE: PREG TEST UR: POSITIVE — AB

## 2017-03-21 MED ORDER — COMPLETENATE 29-1 MG PO CHEW: 1.0000 | CHEWABLE_TABLET | Freq: Every day | ORAL | 4 refills | Status: AC

## 2017-03-21 NOTE — Patient Instructions (Signed)
1. Amenorrhea Uncertain last menstrual period probably from December 1 - February 21, 2017.Amber Fuentes.  Unprotected intercourse 05 March 2017.  Urine pregnancy test positive today.  First trimester per gynecologic exam today.  Will follow up in 2 weeks for viability and ultrasound dating.  We will decide on OB care referral at next visit. - Pregnancy, urine  2. Uncertain dates, antepartum, first trimester As above.  Follow-up ultrasound viability and dating.  Will start on prenatal vitamins.  Nutrition discussed.  Currently only nauseated, no vomiting.  Will call back if vomiting to discuss management. - US Transvaginal Non-OB; Future  Other orders - prenatal vitamin w/FE, FA (NATACHEW) 29-1 MG CHEW chewable tablet; Chew 1 tablet by mouth daily at 12 noon.  Amber Fuentes, fue un placer conocerle hoy!    Plan de alimentacin para mujeres embarazadas (Eating Plan for Pregnant Women) Durante el [place], el cuerpo Pension scheme managernecesitar recibir alimentacin extra para brindar sustento al beb que est creciendo. Se recomienda que consuma lo siguiente:  150caloras extra diariamente Amber Fuentes, medicaldurante el primer trimestre.  300caloras extra diariamente durante el segundo trimestre.  300caloras extra diariamente durante el tercer trimestre. Comer una dieta saludable y bien equilibrada es muy importante para su salud y la del beb. Adems, usted tiene ms necesidad de recibir algunas vitaminas y Solvangminerales, como cido flico, calcio, hierro y vitaminaD. QU DEBO SABER ACERCA DE LA ALIMENTACIN DURANTE EL EMBARAZO?  No trate de Geophysical data processoradelgazar o hacer una dieta durante el embarazo.  Elija alimentos nutritivos y saludables. Opte por la mitad de un sndwich con un vaso de Primary school teacherleche en lugar de una barra de [place] Grovedulce o una bebida endulzada con azcar que contenga muchas caloras.  Limite el consumo general de alimentos con "caloras vacas". Entre los alimentos con poco valor nutritivo se Office Depotincluyen los dulces, los postres, los Seven Milecaramelos, las  bebidas endulzadas con International aid/development workerazcar y los alimentos fritos.  Consuma una amplia variedad de alimentos, especialmente frutas y verduras.  Tome una vitamina prenatal para ayudar a Patent examinersatisfacer las necesidades adicionales durante el Amber Fuentes, especficamente de cido flico, hierro, calcio y vitaminaD.  Haga actividad fsica. Pdale al mdico que le recomiende ejercicios que sean especficos para usted.  Cuide la salubridad y la higiene en relacin con los alimentos, por ejemplo, lvese las manos antes de comer y despus de preparar carne cruda. Esto ayuda a USAAevitar las enfermedades de origen alimentario, como la listeriosis, que puede ser muy peligrosa para el beb. Pdale al mdico ms informacin sobre esta enfermedad. CMO SE VEN 150CALORAS EXTRA? Las opciones saludables con 150caloras extra diarias podran ser cualquiera de las siguientes:  Yogur natural con bajo contenido de grasa (6 a 8onzas [170 a 230g]) con taza de frutos rojos.  1manzana con 2cucharaditas de Singaporemantequilla de man.  Verduras cortadas en trozos con de taza de hummus.  Leche chocolatada con bajo contenido de grasa (8onzas [27740ml] o 1taza).  1tira de queso en hebras con 1naranja mediana.  emparedado de Singaporemantequilla de man y Ghanamermelada en pan integral (1cucharadita de Bradshawmantequilla de man). Para llegar a 300caloras, podra comer dos de esas opciones saludables diariamente. CUL ES LA CANTIDAD SALUDABLE DE PESO QUE SE DEBE AUMENTAR? La cantidad de peso que es recomendable aumentar depende de su ndice de masa corporal [facility] antes del embarazo. Si su I[facility] antes del embarazo:  Era menos de 7918 (bajo peso), debe aumentar de 28 a 40 libras (13 a 18kg).  Era de 2318 a 24,9 (normal), debe aumentar de 25 a 35libras (11 a 16kg).  Era  de 25 a 29,9 (sobrepeso), debe aumentar de 15 a 25libras (7 a 11kg).  Era de ms de30 (obeso), debe aumentar de 11 a 20libras (5 a 10kg). QU SUCEDE SI ESTOY EMBARAZADA DE  GEMELOS O MS BEBS? Generalmente, las embarazadas que tendrn gemelos o ms bebs tal vez deban aumentar la ingesta calrica diaria entre 300 y 600caloras. El rango de aumento de peso total recomendado es de 25 a 54libras (11 a 24kg), en funcin del IMC antes del embarazo. Hable con el mdico para obtener pautas especficas [employer] necesidades nutritivas [address], el aumento de peso y el ejercicio durante el [place]. QU ALIMENTOS PUEDO COMER? Cereales [employer]. Intente elegir los cereales integrales, como el pan integral, la avena o el arroz integral. Verduras Todas las verduras. Intente consumir verduras de colores y tipos diferentes para obtener una gama completa de vitaminas y minerales. Recuerde lavar bien las verduras antes de comerlas. Frutas Todas las frutas. Intente consumir frutas de colores y tipos diferentes para obtener una gama completa de vitaminas y minerales. Recuerde lavar bien las frutas antes de comerlas. Carnes y [address] fuentes de protenas Carnes [place], entre ellas, [address], [place], pescado y cortes [place] de carne de [place], [place] o cerdo. Asegrese de que todas las carnes estn bien cocidas. Tofu. [place]. Frijoles. Huevos. Mantequilla de man y Special educational needs teacher de frutos secos. Mariscos, por ejemplo, camarones, cangrejo y [place]. Si opta por los pescados, elija aquellos que tienen un alto contenido de cidos grasos omega-3, incluidos el salmn, el arenque, los [place], la [place], las sardinas y el abadejo. Asegrese de que todas las carnes estn cocidas a temperaturas que las vuelvan aptas para el consumo. Lcteos Leche y derivados de la leche pasteurizados. Yogur y queso pasteurizados. [employer]. PPG Industries. CHS Inc. Jugos que contengan jugo de frutas o de verduras al 100%. Ts sin cafena y caf descafeinado. Se pueden tomar bebidas que contengan cafena, pero es mejor evitar esta sustancia. La ingesta total de cafena debe ser de menos de  200mg  diarios (12onzas [319ml] de caf, t o refresco) o como el mdico se lo haya indicado. Condimentos Todos los condimentos pasteurizados. Dulces y NVR Inc y los postres. Grasas y [address] las grasas y los aceites. Los artculos mencionados arriba pueden no ser [employer] de las bebidas o los alimentos recomendados. Comunquese con el nutricionista para conocer ms opciones. QU ALIMENTOS NO SE RECOMIENDAN? Verduras Jugos de verduras no pasteurizados (crudos). Frutas Jugos de frutas no pasteurizados (crudos). Carnes y [address] fuentes de protenas Embutidos que contengan nitratos, como [place], salame y perros calientes. Embutidos, mortadela u otros fiambres (a menos que estn recalentados al punto de emitir vapor). Pats refrigerados, pastas untables de carne de una carnicera, frutos de mar ahumados que se encuentran en la seccin de productos refrigerados de [place]. Pescado crudo, como sushi o sashimi. Pescados con alto contenido de mercurio, por ejemplo, azulejo, tiburn, pez espada y caballa. Carnes crudas, como atn o tartar de carne de res. Carnes y pollo a Merchant navy officer. Asegrese de que todas las carnes estn cocidas a temperaturas que las vuelvan aptas para el consumo. Lcteos Leche no pasteurizada (cruda) y cualquier alimento que [place]. Quesos blandos, como feta, queso [place], Joes fresco, [place], quesos [place], quesos azules y Aeronautical engineer panela (salvo que estn elaborados con leche pasteurizada, lo cual debe constar en la etiqueta). Bebidas Alcohol. Bebidas endulzadas con azcar, como refrescos, ts o bebidas energizantes. Condimentos Alimentos y bebidas caseros hechos en  casa, como pickles, chucrut o bebidas con kombucha (se pueden tomar las versiones que estn pasteurizadas y se vendan en las tiendas). Otros Ensaladas preparadas en la tienda, como ensalada de [place], de [place], de [place], de atn y de frutos de Investment banker, operational. Los artculos  mencionados arriba pueden no ser [employer]on de las bebidas y los alimentos que se Theatre stage manager. Comunquese con el nutricionista para obtener ms informacin. Esta informacin no tiene Theme park manager el consejo del mdico. Asegrese de hacerle al mdico cualquier pregunta que tenga. Document Released: 12/04/2013 Document Revised: 06/13/2015 Document Reviewed: 08/04/2013 Elsevier Interactive Patient Education  2018 [employer].

## 2017-03-21 NOTE — Progress Notes (Signed)
    [facility]landa [facility]Maria [facility] 11-27-1980 [id number]        37 y.o.  [id number] Married.  Son is 37 yo, daughter is 37 yo  RP:  HPT positive a week ago  HPI:  Uncertain of LMP between 1st and 20th of December 2018.  Was using condoms, but didn't use on 03/05/2017.  If that is the conception day, [facility]  Nausea present, no vomiting.  No vaginal bleeding.  No pelvic pain.  Breasts wnl.  1st pregnancy SVD at term, no Cx 2nd pregnancy C/S at 37+ wks for Oligo/Breech, no Cx  Past medical history,surgical history, problem list, medications, allergies, family history and social history were all reviewed and documented in the EPIC chart.  Directed ROS with pertinent positives and negatives documented in the history of present illness/assessment and plan.  Exam:  Vitals:   03/21/17 0945  BP: 128/86  Weight: 190 lb (86.2 kg)   General appearance:  Normal  Gyn exam:  Vulva normal.  Bimanual exam:  Uterus AV, mildly increased in size, mobile, NT.  Cervix long, firm, closed.  Normal secretions, no bleeding.  UPT positive   Assessment/Plan:  37 y.o. G2P2002  1. Amenorrhea Uncertain last menstrual period probably from December 1 - February 21, 2017.Marland Kitchen.  Unprotected intercourse 05 March 2017.  Urine pregnancy test positive today.  First trimester per gynecologic exam today.  Will follow up in 2 weeks for viability and ultrasound dating.  We will decide on OB care referral at next visit. - Pregnancy, urine  2. Uncertain dates, antepartum, first trimester As above.  Follow-up ultrasound viability and dating.  Will start on prenatal vitamins.  Nutrition discussed.  Currently only nauseated, no vomiting.  Will call back if vomiting to discuss management. - US Transvaginal Non-OB; Future  Other orders - prenatal vitamin w/FE, FA (NATACHEW) 29-1 MG CHEW chewable tablet; Chew 1 tablet by mouth daily at 12 noon.  Genia DelMarie-Lyne Ulice Follett MD, 9:57 AM 03/21/2017

## 2017-03-27 ENCOUNTER — Other Ambulatory Visit: Payer: Self-pay | Admitting: Obstetrics & Gynecology

## 2017-03-27 DIAGNOSIS — Z3491 Encounter for supervision of normal pregnancy, unspecified, first trimester: Secondary | ICD-10-CM

## 2017-04-03 ENCOUNTER — Ambulatory Visit (INDEPENDENT_AMBULATORY_CARE_PROVIDER_SITE_OTHER): Payer: 59

## 2017-04-03 ENCOUNTER — Encounter: Payer: Self-pay | Admitting: Obstetrics & Gynecology

## 2017-04-03 ENCOUNTER — Ambulatory Visit: Payer: 59 | Admitting: Obstetrics & Gynecology

## 2017-04-03 DIAGNOSIS — O3680X1 Pregnancy with inconclusive fetal viability, fetus 1: Secondary | ICD-10-CM | POA: Diagnosis not present

## 2017-04-03 DIAGNOSIS — O26841 Uterine size-date discrepancy, first trimester: Secondary | ICD-10-CM

## 2017-04-03 DIAGNOSIS — Z3491 Encounter for supervision of normal pregnancy, unspecified, first trimester: Secondary | ICD-10-CM

## 2017-04-03 DIAGNOSIS — O3680X0 Pregnancy with inconclusive fetal viability, not applicable or unspecified: Secondary | ICD-10-CM

## 2017-04-03 NOTE — Progress Notes (Signed)
    Amber Fuentes 27-Jun-1980 [id number]        37 y.o.  [id number] Married  RP:  Ob US dating and viability  HPI:  Uncertain of LMP.  Was using condoms on/off.  Nausea present, no vomiting.  No vaginal bleeding.  No pelvic pain.  Breasts wnl.  1st pregnancy SVD at term, no Cx 2nd pregnancy C/S at 37+ wks for Oligo/Breech, no Cx   OB History  Gravida Para Term Preterm AB Living  3 2 2     2   SAB TAB Ectopic Multiple Live Births          2    # Outcome Date GA Lbr Len/2nd Weight Sex Delivery Anes PTL Lv  3 Current           2 Term     F CS-Unspec  N LIV  1 Term     M Vag-Spont  N LIV      Past medical history,surgical history, problem list, medications, allergies, family history and social history were all reviewed and documented in the EPIC chart.   Directed ROS with pertinent positives and negatives documented in the history of present illness/assessment and plan.  Exam:  There were no vitals filed for this visit. General appearance:  Normal  Ob US today: Single intrauterine pregnancy at 8 weeks and 4 days by crown-rump length (23.2 mm) for an EDD November 09, 2017.  Fetal heart rate 182/min.  Yolk sac is normal.  Cervix long and closed.  Right ovarian corpus luteum cyst.  Left ovary normal.  No free fluid in the posterior cul-de-sac.   Assessment/Plan:  [age] y.o. G3P2002   1. Encounter to determine fetal viability and dating of pregnancy, single Single intrauterine pregnancy at 8 weeks and 4 days by ultrasound.  Fetal heart rate at 182/min.  Continue on prenatal vitamins.  Vitamin B6 recommended for nausea.  Patient will call for treatment if develops vomiting.  [facility]  Counseling on above issues more than 50% for 15 minutes.  Genia DelMarie-Lyne Arthur Speagle MD, 12:45 PM 04/03/2017

## 2017-04-06 ENCOUNTER — Encounter: Payer: Self-pay | Admitting: Obstetrics & Gynecology

## 2017-04-06 NOTE — Patient Instructions (Signed)
1. Encounter to determine fetal viability and dating of pregnancy, single Single intrauterine pregnancy at 8 weeks and 4 days by ultrasound.  Fetal heart rate at 182/min.  Continue on prenatal vitamins.  Vitamin B6 recommended for nausea.  Patient will call for treatment if develops vomiting.  Will establish obstetrical care at [facility].  Byrd Hesselbach, fue un placer verle de nuevo hoy!  Felicidades!   Plan de alimentacin para mujeres embarazadas (Eating Plan for Pregnant Women) Durante el Huey, el cuerpo Pension scheme manager recibir alimentacin extra para brindar sustento al beb que est creciendo. Se recomienda que consuma lo siguiente:  150caloras extra diariamente Physicist, medical trimestre.  300caloras extra diariamente durante el segundo trimestre.  300caloras extra diariamente durante el tercer trimestre. Comer una dieta saludable y bien equilibrada es muy importante para su salud y la del beb. Adems, usted tiene ms necesidad de recibir algunas vitaminas y [place], como cido flico, calcio, hierro y vitaminaD. QU DEBO SABER ACERCA DE LA ALIMENTACIN DURANTE EL EMBARAZO?  No trate de Geophysical data processor o hacer una dieta durante el embarazo.  Elija alimentos nutritivos y saludables. Opte por la mitad de un sndwich con un vaso de Primary school teacher de una barra de [place] o una bebida endulzada con azcar que contenga muchas caloras.  Limite el consumo general de alimentos con "caloras vacas". Entre los alimentos con poco valor nutritivo se Office Depot, los postres, los [place], las bebidas endulzadas con International aid/development worker y los alimentos fritos.  Consuma una amplia variedad de alimentos, especialmente frutas y verduras.  Tome una vitamina prenatal para ayudar a Patent examiner las necesidades adicionales durante el [place], especficamente de cido flico, hierro, calcio y vitaminaD.  Haga actividad fsica. Pdale al mdico que le recomiende ejercicios que sean especficos para  usted.  Cuide la salubridad y la higiene en relacin con los alimentos, por ejemplo, lvese las manos antes de comer y despus de preparar carne cruda. Esto ayuda a USAA de origen alimentario, como la listeriosis, que puede ser muy peligrosa para el beb. Pdale al mdico ms informacin sobre esta enfermedad. CMO SE VEN 150CALORAS EXTRA? Las opciones saludables con 150caloras extra diarias podran ser cualquiera de las siguientes:  Yogur natural con bajo contenido de grasa (6 a 8onzas [170 a 230g]) con taza de frutos rojos.  con 2cucharaditas de [place] de man.  Verduras cortadas en trozos con de taza de hummus.  Leche chocolatada con bajo contenido de grasa (8onzas [214ml] o 1taza).  1tira de queso en hebras con 1naranja mediana.  emparedado de [place] de man y [place] en pan integral (1cucharadita de [place] de man). Para llegar a 300caloras, podra comer dos de esas opciones saludables diariamente. CUL ES LA CANTIDAD SALUDABLE DE PESO QUE SE DEBE AUMENTAR? La cantidad de peso que es recomendable aumentar depende de su ndice de masa corporal [facility]) antes del embarazo. Si su IMC antes del embarazo:  Era menos de 106 (bajo peso), debe aumentar de 28 a 40 libras (13 a 18kg).  Era de 54 a 24,9 (normal), debe aumentar de 25 a 35libras (11 a 16kg).  Era de 25 a 29,9 (sobrepeso), debe aumentar de 15 a 25libras (7 a 11kg).  Era de ms de30 (obeso), debe aumentar de 11 a 20libras (5 a 10kg). QU SUCEDE SI ESTOY EMBARAZADA DE GEMELOS O MS BEBS? Generalmente, las embarazadas que tendrn gemelos o ms bebs tal vez deban aumentar la ingesta calrica diaria entre 300 y 600caloras. El rango de aumento de [place] total  recomendado es de 25 a 54libras (11 a 24kg), en funcin del IMC antes del embarazo. Hable con el mdico para obtener pautas especficas Rohm and Haassobre las necesidades nutritivas [address]ionales, el aumento de peso y  el ejercicio durante el Selawikembarazo. QU ALIMENTOS PUEDO COMER? Cereales Atmos Energyodos los cereales. Intente elegir los cereales integrales, como el pan integral, la avena o el arroz integral. Verduras Todas las verduras. Intente consumir verduras de colores y tipos diferentes para obtener una gama completa de vitaminas y minerales. Recuerde lavar bien las verduras antes de comerlas. Frutas Todas las frutas. Intente consumir frutas de colores y tipos diferentes para obtener una gama completa de vitaminas y minerales. Recuerde lavar bien las frutas antes de comerlas. Carnes y [address]otras fuentes de protenas Carnes Addymagras, entre ellas, [address] Drpollo, Blawenburgpavo, pescado y cortes Evelynshiremagros de carne de [place], Belizeternera o cerdo. Asegrese de que todas las carnes estn bien cocidas. Tofu. [place]. Frijoles. Huevos. Mantequilla de man y Special educational needs teacherotras mantequillas de frutos secos. Mariscos, por ejemplo, camarones, cangrejo y [place]. Si opta por los pescados, elija aquellos que tienen un alto contenido de cidos grasos omega-3, incluidos el salmn, el arenque, los Amherstmejillones, la Saralandtrucha, las sardinas y el abadejo. Asegrese de que todas las carnes estn cocidas a temperaturas que las vuelvan aptas para el consumo. Lcteos Leche y derivados de la leche pasteurizados. Yogur y queso pasteurizados. CSX CorporationQueso cottage. PPG IndustriesCrema cida. CHS IncBebidas Agua. Jugos que contengan jugo de frutas o de verduras al 100%. Ts sin cafena y caf descafeinado. Se pueden tomar bebidas que contengan cafena, pero es mejor evitar esta sustancia. La ingesta total de cafena debe ser de menos de 200mg  diarios (12onzas [31655ml] de caf, t o refresco) o como el mdico se lo haya indicado. Condimentos Todos los condimentos pasteurizados. Dulces y NVR Incpostres Todos los dulces y los postres. Grasas y [address] Avenueaceites Todas las grasas y los aceites. Los artculos mencionados arriba pueden no ser Raytheonuna lista completa de las bebidas o los alimentos recomendados. Comunquese con el nutricionista para  conocer ms opciones. QU ALIMENTOS NO SE RECOMIENDAN? Verduras Jugos de verduras no pasteurizados (crudos). Frutas Jugos de frutas no pasteurizados (crudos). Carnes y [address]ras fuentes de protenas Embutidos que contengan nitratos, como Zebulonpanceta, salame y perros calientes. Embutidos, mortadela u otros fiambres (a menos que estn recalentados al punto de emitir vapor). Pats refrigerados, pastas untables de carne de una carnicera, frutos de mar ahumados que se encuentran en la seccin de productos refrigerados de Cameroonuna tienda. Pescado crudo, como sushi o sashimi. Pescados con alto contenido de mercurio, por ejemplo, azulejo, tiburn, pez espada y caballa. Carnes crudas, como atn o tartar de carne de res. Carnes y pollo a Merchant navy officermedio cocer. Asegrese de que todas las carnes estn cocidas a temperaturas que las vuelvan aptas para el consumo. Lcteos Leche no pasteurizada (cruda) y cualquier alimento que Omancontenga leche cruda. Quesos blandos, como feta, queso Calvaryblanco, Reedsqueso fresco, TancredBrie, quesos Burchinalamembert, quesos azules y Aeronautical engineerqueso panela (salvo que estn elaborados con leche pasteurizada, lo cual debe constar en la etiqueta). Bebidas Alcohol. Bebidas endulzadas con azcar, como refrescos, ts o bebidas energizantes. Condimentos Alimentos y bebidas caseros hechos en casa, como pickles, chucrut o bebidas con kombucha (se pueden tomar las versiones que estn pasteurizadas y se vendan en las tiendas). Otros Ensaladas preparadas en la tienda, como ensalada de Silver Springsjamn, de Ithacapollo, de Lansinghuevo, de atn y de frutos de Investment banker, operationalmar. Los artculos mencionados arriba pueden no ser Raytheonuna lista completa de las bebidas y los alimentos que se Theatre stage managerdeben evitar. Comunquese con el nutricionista  para obtener ms informacin. Esta informacin no tiene Theme park manager el consejo del mdico. Asegrese de hacerle al mdico cualquier pregunta que tenga. Document Released: 12/04/2013 Document Revised: 06/13/2015 Document Reviewed: 08/04/2013 Elsevier  Interactive Patient Education  2018 ArvinMeritor.

## 2017-04-28 NOTE — Progress Notes (Signed)
Subjective:    Patient ID: Amber Fuentes, female    DOB: [birth year], 37 y.o.   MRN: [id number]   CC: New Patient  HPI: Patient today presenting for New Patient appointment. Also reports that she is pregnant. [facility] and was informed she was pregnant. Patient is uncertain of LMP but thinks it may have been around 02/09/17 or 02/11/17. Prior to pregnancy, patient had regular periods and was not on birth control. This is a planned pregnancy. Patient has had 2 previous pregnancys: 1st pregnancy SVD at term, no complications. 2nd pregnancy C/S at 37+ wks for Oligo/Breech, no complications. Patient is taking daily PNV. No other medications used.   US @ 1164w4d showing EDD of 11/09/2017 by crown-rump length.   PMHx: History reviewed. No pertinent past medical history.   Surgical Hx: Past Surgical History:  Procedure Laterality Date  . CESAREAN SECTION  11/22/2005     Family Hx: Family History  Problem Relation Age of Onset  . Cancer Mother        CERVICAL     Social Hx: Current Social History      Who lives at home: Husband, 2 children (37 y.o. And 37 y.o.)  04/30/2017  Who would speak for you about health care matters: Husband 04/30/2017  Transportation: Owns a car 04/30/2017 Important Relationships & Pets: none 04/30/2017  Current Stressors: none 04/30/2017 Work / Education:  Does not work, 6th grade education 04/30/2017 Religious / Personal Beliefs: none 04/30/2017 Interests / Fun: dance 04/30/2017 Other: no tobacco use, prior to pregnancy (only on special occasions prior to pregnancy), no drug use 04/30/2017   Medications: PNV daily  ROS: Woman:  Patient reports no  vision/ hearing changes, anorexia, weight change, fever ,adenopathy, chest pain, edema, dyspnea(rest, exertional, paroxysmal nocturnal), abdominal pain, excessive heart burn, GU symptoms(dysuria, hematuria, pyuria, voiding/incontinence  Issues) syncope,  focal weakness,concerning skin lesions, depression, anxiety, abnormal bruising/bleeding, major joint swelling, breast masses or abnormal vaginal bleeding.    Endorses abdominal pain occasionally. Endorses nausea/vomiting but thinks this is 2/2 pregnancy.   Preventative Screening Pap test: Negative Pap 08/2016 Flu vaccine: will get today Tetanus vaccine: unsure     Objective:  BP 98/62   Pulse 78   Temp 98.2 F (36.8 C) (Oral)   Ht 5\' 4"  (1.626 m)   Wt 188 lb 3.2 oz (85.4 kg)   LMP  (LMP Unknown)   SpO2 99%   BMI 32.30 kg/m  Vitals and nursing note reviewed  General: well nourished, in no acute distress HEENT: normocephalic, TM's visualized bilaterally, no scleral icterus or conjunctival pallor, no nasal discharge, moist mucous membranes, good dentition without erythema or discharge noted in posterior oropharynx Neck: supple, non-tender, without lymphadenopathy Cardiac: RRR, clear S1 and S2, no murmurs, rubs, or gallops Respiratory: clear to auscultation bilaterally, no increased work of breathing Abdomen: soft, nontender, nondistended, no masses or organomegaly. Bowel sounds present Extremities: no edema or cyanosis. Warm, well perfused. 2+ radial pulses bilaterally Skin: warm and dry, no rashes noted Neuro: alert and oriented, no focal deficits, CN 2-12 grossly intact   Assessment & Plan:    Healthcare maintenance Flu vaccine administered today   Abdominal pregnancy with intrauterine pregnancy -Obtained initial OB labs today including: OB panel including HIV, Sickle cell panel, urine dipstick, and OB urine culture  -advised patient to make initial OB appointment prior to leaving clinic today, patient and husband verbalized understanding and agreement with plan  -follow up  as soon as possible for initial OB appointment    Return in about 1 week (around 05/07/2017) for Initial OB .   Oralia Manis, DO, PGY-1

## 2017-04-30 ENCOUNTER — Ambulatory Visit (INDEPENDENT_AMBULATORY_CARE_PROVIDER_SITE_OTHER): Payer: 59 | Admitting: Family Medicine

## 2017-04-30 ENCOUNTER — Encounter: Payer: Self-pay | Admitting: Family Medicine

## 2017-04-30 ENCOUNTER — Other Ambulatory Visit: Payer: Self-pay

## 2017-04-30 VITALS — BP 98/62 | HR 78 | Temp 98.2°F | Ht 64.0 in | Wt 188.2 lb

## 2017-04-30 DIAGNOSIS — Z23 Encounter for immunization: Secondary | ICD-10-CM | POA: Diagnosis not present

## 2017-04-30 DIAGNOSIS — Z0000 Encounter for general adult medical examination without abnormal findings: Secondary | ICD-10-CM | POA: Diagnosis not present

## 2017-04-30 DIAGNOSIS — O0001 Abdominal pregnancy with intrauterine pregnancy: Secondary | ICD-10-CM

## 2017-04-30 LAB — POCT URINALYSIS DIP (MANUAL ENTRY)
Blood, UA: NEGATIVE
Glucose, UA: NEGATIVE mg/dL
Nitrite, UA: NEGATIVE
Urobilinogen, UA: 1 E.U./dL
pH, UA: 6.5 (ref 5.0–8.0)

## 2017-04-30 NOTE — Addendum Note (Signed)
Addended by: Jennette BillBUSICK, ROBERT L on: 04/30/2017 03:43 PM   Modules accepted: Orders

## 2017-04-30 NOTE — Assessment & Plan Note (Addendum)
Flu vaccine administered today.

## 2017-04-30 NOTE — Patient Instructions (Signed)
Safe Medications in Pregnancy   Acne:  Benzoyl Peroxide  Salicylic Acid   Backache/Headache:  Tylenol: 2 regular strength every 4 hours OR        2 Extra strength every 6 hours   Colds/Coughs/Allergies:  Benadryl (alcohol free) 25 mg every 6 hours as needed  Breath right strips  Claritin  Cepacol throat lozenges  Chloraseptic throat spray  Cold-Eeze- up to three times per day  Cough drops, alcohol free  Flonase (by prescription only)  Guaifenesin  Mucinex  Robitussin DM (plain only, alcohol free)  Saline nasal spray/drops  Sudafed (pseudoephedrine) & Actifed * use only after [redacted] weeks gestation and if you do not have high blood pressure  Tylenol  Vicks Vaporub  Zinc lozenges  Zyrtec   Constipation:  Colace  Ducolax suppositories  Fleet enema  Glycerin suppositories  Metamucil  Milk of magnesia  Miralax  Senokot  Smooth move tea   Diarrhea:  Kaopectate  Imodium A-D   *NO pepto Bismol   Hemorrhoids:  Anusol  Anusol HC  Preparation H  Tucks   Indigestion:  Tums  Maalox  Mylanta  Zantac  Pepcid   Insomnia:  Benadryl (alcohol free) 25mg  every 6 hours as needed  Tylenol PM  Unisom, no Gelcaps   Leg Cramps:  Tums  MagGel   Nausea/Vomiting:  Bonine  Dramamine  Emetrol  Ginger extract  Sea bands  Meclizine  Nausea medication to take during pregnancy:  Unisom (doxylamine succinate 25 mg tablets) Take one tablet daily at bedtime. If symptoms are not adequately controlled, the dose can be increased to a maximum recommended dose of two tablets daily (1/2 tablet in the morning, 1/2 tablet mid-afternoon and one at bedtime).  Vitamin B6 100mg  tablets. Take one tablet twice a day (up to 200 mg per day).   Skin Rashes:  Aveeno products  Benadryl cream or 25mg  every 6 hours as needed  Calamine Lotion  1% cortisone cream   Yeast infection:  Gyne-lotrimin 7  Monistat 7    **If taking multiple medications, please check labels to avoid  duplicating the same active ingredients  **take medication as directed on the label  ** Do not exceed 4000 mg of tylenol in 24 hours  **Do not take medications that contain aspirin or ibuprofen    It was a pleasure seeing you today.   Today we discussed your new patient visit and pregnancy  For your pregnancy: please schedule an initial OB visit ASAP. You may make this appointment up front before you leave  Thank you for getting the flu shot  I have ordered your initial OB labs today. I will either call or send a letter with these results.   Please follow up as soon as possible for your initial OB visit or sooner if symptoms persist or worsen. Please call the clinic immediately if you have any concerns.   Our clinic's number is [phone]. Please call with questions or concerns.   Please go to the emergency room if you have vaginal bleeding, cramping, or leakage of fluids   Thank you,  Oralia ManisSherin Joron Velis, DO

## 2017-04-30 NOTE — Assessment & Plan Note (Signed)
-  Obtained initial OB labs today including: OB panel including HIV, Sickle cell panel, urine dipstick, and OB urine culture  -advised patient to make initial OB appointment prior to leaving clinic today, patient and husband verbalized understanding and agreement with plan  -follow up as soon as possible for initial OB appointment

## 2017-05-01 LAB — OBSTETRIC PANEL, INCLUDING HIV
ANTIBODY SCREEN: NEGATIVE
HIV SCREEN 4TH GENERATION: NONREACTIVE
Hepatitis B Surface Ag: NEGATIVE
RH TYPE: POSITIVE
RPR Ser Ql: NONREACTIVE
Rubella Antibodies, IGG: 9.79 index (ref >0.99)

## 2017-05-01 LAB — CMP14+CBC/D/PLT+FER+RETIC+V...
ALK PHOS: 107 IU/L (ref 39–117)
ALT: 30 IU/L (ref 0–32)
AST: 21 IU/L (ref 0–40)
Albumin/Globulin Ratio: 1 — ABNORMAL LOW (ref 1.2–2.2)
Albumin: 3.6 g/dL (ref 3.5–5.5)
BASOS ABS: 0 10*3/uL (ref 0.0–0.2)
BUN/Creatinine Ratio: 9 (ref 9–23)
BUN: 5 mg/dL — AB (ref 6–20)
Basos: 0 %
Bilirubin Total: 0.2 mg/dL (ref 0.0–1.2)
CHLORIDE: 104 mmol/L (ref 96–106)
CO2: 19 mmol/L — ABNORMAL LOW (ref 20–29)
CREATININE: 0.56 mg/dL — AB (ref 0.57–1.00)
Calcium: 8.8 mg/dL (ref 8.7–10.2)
EOS (ABSOLUTE): 0 10*3/uL (ref 0.0–0.4)
Eos: 0 %
FERRITIN: 63 ng/mL (ref 15–150)
GFR, EST AFRICAN AMERICAN: 139 mL/min/{1.73_m2} (ref >59)
GFR, EST NON AFRICAN AMERICAN: 120 mL/min/{1.73_m2} (ref >59)
GLUCOSE: 84 mg/dL (ref 65–99)
Globulin, Total: 3.6 g/dL (ref 1.5–4.5)
Hematocrit: 37 % (ref 34.0–46.6)
Hemoglobin: 12.2 g/dL (ref 11.1–15.9)
Immature Grans (Abs): 0 10*3/uL (ref 0.0–0.1)
Immature Granulocytes: 0 %
Lymphocytes Absolute: 1.7 10*3/uL (ref 0.7–3.1)
Lymphs: 20 %
MCH: 27.9 pg (ref 26.6–33.0)
MCHC: 33 g/dL (ref 31.5–35.7)
MCV: 85 fL (ref 79–97)
MONOS ABS: 0.3 10*3/uL (ref 0.1–0.9)
Monocytes: 4 %
NEUTROS ABS: 6.3 10*3/uL (ref 1.4–7.0)
Neutrophils: 76 %
PLATELETS: 303 10*3/uL (ref 150–379)
POTASSIUM: 3.8 mmol/L (ref 3.5–5.2)
RBC: 4.37 x10E6/uL (ref 3.77–5.28)
RDW: 15.1 % (ref 12.3–15.4)
Retic Ct Pct: 1 % (ref 0.6–2.6)
SODIUM: 138 mmol/L (ref 134–144)
Total Protein: 7.2 g/dL (ref 6.0–8.5)
Vit D, 25-Hydroxy: 20.9 ng/mL — ABNORMAL LOW (ref 30.0–100.0)
WBC: 8.4 10*3/uL (ref 3.4–10.8)

## 2017-05-02 LAB — URINE CULTURE, OB REFLEX

## 2017-05-02 LAB — CULTURE, OB URINE

## 2017-05-07 ENCOUNTER — Encounter: Payer: Self-pay | Admitting: Family Medicine

## 2017-05-23 NOTE — Progress Notes (Signed)
Amber HaymakerMaria J Fuentes is a 37 y.o. yo G3P2002 at 7381w6d who presents for her initial prenatal visit. Pregnancy is planned She reports unusual food cravings for oatmeal but otherwise doing well. She is taking PNV. See flow sheet for details.  2 Prior pregnancies: #1: female, SVD, no complications #2: female, c-section for breech/oligohydramnios, delivered at 3537 1/2 weeks   PMH, POBH, FH, meds, allergies and Social Hx reviewed. See OB charting for further details.  US @ 10227w4d showing EDD of 11/09/2017 by crown-rump length.   Prenatal Exam: Gen: Well nourished, well developed.  No distress.  Vitals noted. HEENT: Normocephalic, atraumatic.   CV: RRR no murmur, gallops or rubs Lungs: CTAB.  Normal respiratory effort without wheezes or rales. Abd: soft, NTND. +BS.  Uterus not appreciated above pelvis. GU: Normal external female genitalia without lesions.  Normal vaginal, well rugated without lesions. No vaginal discharge.  Bimanual exam: No adnexal mass or some tenderness to palpation of left adnexa. No CMT.  Uterus size 18cm Ext: No clubbing, cyanosis or edema. Psych: Normal grooming and dress.  Not depressed or anxious appearing.  Normal thought content and process without flight of ideas or looseness of associations.  Assessment & Plan: 1) 37 y.o. yo G3P2002 at 3181w6d via early ultrasound doing well.   Current pregnancy issues include early alcohol use in December for holidays (prior to knowing patient was pregnant)  Dating is reliable. By 8 wk US @ 4727w4d showing EDD of 11/09/2017 by crown-rump length.  Prenatal labs reviewed, notable for Vit D Deficiency. Genetic screening offered: patient willing to obtain labs today. Early glucola is indicated, obtained today  AFP, Quad Screen obtained Sickle cell screen obtained GC/Chlamydia obtained Anatomy ultrasound scheduled for April 12th, 2019 @2pm , discussed with patient and date written in AVS PHQ-9 score 5, not difficult at all Pregnancy Medical Home  forms completed and reviewed.  Bleeding and pain precautions reviewed. Importance of prenatal vitamins reviewed.  Handout on important 2nd trimester information given  [facility] with attending

## 2017-05-24 ENCOUNTER — Other Ambulatory Visit: Payer: Self-pay

## 2017-05-24 ENCOUNTER — Encounter: Payer: Self-pay | Admitting: Family Medicine

## 2017-05-24 ENCOUNTER — Other Ambulatory Visit (HOSPITAL_COMMUNITY)
Admission: RE | Admit: 2017-05-24 | Discharge: 2017-05-24 | Disposition: A | Discharge status: Home / Self Care | Payer: 59 | Source: Ambulatory Visit | Attending: Family Medicine | Admitting: Family Medicine

## 2017-05-24 ENCOUNTER — Ambulatory Visit (INDEPENDENT_AMBULATORY_CARE_PROVIDER_SITE_OTHER): Payer: 59 | Admitting: Family Medicine

## 2017-05-24 VITALS — BP 102/58 | HR 75 | Temp 98.0°F | Wt 186.2 lb

## 2017-05-24 DIAGNOSIS — Z3482 Encounter for supervision of other normal pregnancy, second trimester: Secondary | ICD-10-CM | POA: Diagnosis not present

## 2017-05-24 DIAGNOSIS — Z3480 Encounter for supervision of other normal pregnancy, unspecified trimester: Secondary | ICD-10-CM

## 2017-05-24 DIAGNOSIS — O0001 Abdominal pregnancy with intrauterine pregnancy: Secondary | ICD-10-CM

## 2017-05-24 LAB — POCT 1 HR PRENATAL GLUCOSE: Glucose 1 Hr Prenatal, POC: 138 mg/dL

## 2017-05-24 NOTE — Assessment & Plan Note (Signed)
  Nursing Staff Provider  [facility] Dating  US @ 757w4d showing EDD of 11/09/2017 by crown-rump length.   Language   Spanish  Anatomy US    Flu Vaccine  04/30/17 Genetic Screen  NIPS:   AFP:   First Screen:  Quad:    TDaP vaccine    Hgb A1C or  GTT Early  Third trimester   Rhogam     LAB RESULTS   Contraception   Blood Type O/Positive/-- (02/26 1534)   Circumcision  Antibody Negative (02/26 1534)  Pediatrician [facility] Rubella 9.79 (02/26 1534)  Support Person  Husband Regan Rakers(Roberto) RPR Non Reactive (02/26 1534)   Prenatal Classes  HBsAg Negative (02/26 1534)   BTL Consent  HIV Non Reactive (02/26 1534)  VBAC Consent  GBS  (For PCN allergy, check sensitivities)     Pap Neg 08/17/16    Hgb Electro      CF     SMA     Waterbirth  [ ]  Class [ ]  Consent [ ]  CNM visit

## 2017-05-24 NOTE — Patient Instructions (Addendum)
Segundo trimestre de Media planner (Second Trimester of Pregnancy) El segundo trimestre va desde la semana13 hasta la 64, desde el cuarto hasta el sexto mes, y suele ser el momento en el que mejor se siente. En general, las nuseas matutinas han disminuido o han desaparecido completamente. Tendr ms energa y podr aumentarle el apetito. El beb por nacer (feto) se desarrolla rpidamente. Hacia el final del sexto mes, el beb mide aproximadamente 9 pulgadas (23 cm) y pesa alrededor de 1 libras (700 g). Es probable que sienta al beb moverse (dar pataditas) entre las 18 y 77 semanas del Media planner. CUIDADOS EN EL HOGAR  No fume, no consuma hierbas ni beba alcohol. No tome frmacos que el mdico no haya autorizado.  No consuma ningn producto que contenga tabaco, lo que incluye cigarrillos, tabaco de Higher education careers adviser o Psychologist, sport and exercise. Si necesita ayuda para dejar de fumar, consulte al MeadWestvaco. Puede recibir asesoramiento u otro tipo de apoyo para dejar de fumar.  Tome los medicamentos solamente como se lo haya indicado el mdico. Algunos medicamentos son seguros para tomar durante el Media planner y otros no lo son.  Haga ejercicios solamente como se lo haya indicado el mdico. Interrumpa la actividad fsica si comienza a tener calambres.  Ingiera alimentos saludables de [place] regular.  Use un sostn que le brinde buen soporte si sus mamas estn sensibles.  No se d baos de inmersin en agua caliente, baos turcos ni saunas.  Colquese el cinturn de seguridad cuando conduzca.  No coma carne cruda ni queso sin cocinar; evite el contacto con las bandejas sanitarias de los gatos y la tierra que estos animales usan.  Seama.  Tome entre 1500 y 2082m de calcio diariamente comenzando en la [id number] embarazo hMartinsdale  Pruebe tomar un medicamento que la ayude a defecar (un laxante suave) si el mdico lo autoriza. Consuma ms fibra, que se encuentra en las frutas y  verduras frescas y los cereales integrales. Beba suficiente lquido para mantener el pis (orina) claro o de color amarillo plido.  Dese baos de asiento con agua tibia para aBest boyo las molestias causadas por las hemorroides. Use una crema para las hemorroides si el mdico la autoriza.  Si se le hinchan las venas (venas varicosas), use medias de descanso. Levante (eleve) los pies durante 168mutos, 3 o 4veces por daTraining and development officerLimite el consumo de sal en su dieta.  No levante objetos pesados, use zapatos de tacones bajos y sintese derecha.  Descanse con las piernas elevadas si tiene calambres o dolor de cintura.  Visite a su dentista si no lo ha heQuarry managerUse un cepillo de cerdas suaves para cepillarse los dientes. Psese el hilo dental con suavidad.  Puede seguir maAmerican Electric Powera menos que el mdico le indique lo contrario.  Concurra a los controles mdicos.  SOLICITE AYUDA SI:  Siente mareos.  Sufre calambres o presin leves en la parte baja del vientre (abdomen).  Sufre un dolor persistente en el abdomen.  Tiene maHigher education careers advisernuseas), vmitos, o tiene deposiciones acuosas (diarrea).  Advierte un olor ftido que proviene de la vagina.  Siente dolor al orContinental Airlines SOLICITE AYUDA DE INMEDIATO SI:  Tiene fiebre.  Tiene una prdida de lquido por la vagina.  Tiene sangrado o pequeas prdidas vaginales.  Siente dolor intenso o clicos en el abdomen.  Sube o baja de peso rpidamente.  Tiene dificultades para recuperar el aliento y siente dolor en el pecho.  Sbitamente se  le hinchan mucho el rostro, las [place]os, los tobillos, los pies o las piernas.  No ha sentido los movimientos del beb durante Georgianne Fickuna hora.  Siente un dolor de cabeza intenso que no se alivia con medicamentos.  Su visin se modifica.  Esta informacin no tiene Theme park managercomo fin reemplazar el consejo del mdico. Asegrese de hacerle al mdico cualquier pregunta que  tenga. Document Released: 10/22/2012 Document Revised: 03/12/2014 Document Reviewed: 04/22/2012 Elsevier Interactive Patient Education  2017 ArvinMeritorElsevier Inc.   It was a pleasure seeing you today.   Today we discussed your initial prenatal exam  For your pregnancy: today I will get the 1hr glucola and Quad screen. I will either call or send a letter with results. I will also do a sickle cell screen today. You had a gonorrhea and chlamydia test today as part of normal pregnancy labs.   I will schedule your anatomy, it will be on April 12th, 2019 @2pm   [facility] or sooner if symptoms persist or worsen. Please call the clinic immediately if you have any concerns.   Avoid chemicals in hair dye. If you are able to establish care at an OB in [place] please let me know so I can consider if you are safe at that time for travel.   Our clinic's number is [phone]. Please call with questions or concerns.   Please [facility] the emergency room [facility] if you have any vaginal bleeding, leakage of fluid, contractions/cramping, or decreased fetal movement  Thank you,  Oralia ManisSherin Yuleimy Kretz, DO

## 2017-05-25 ENCOUNTER — Encounter: Payer: Self-pay | Admitting: Family Medicine

## 2017-05-26 LAB — SICKLE CELL SCREEN: SICKLE CELL SCREEN: NEGATIVE

## 2017-05-27 ENCOUNTER — Other Ambulatory Visit (INDEPENDENT_AMBULATORY_CARE_PROVIDER_SITE_OTHER): Payer: 59

## 2017-05-27 ENCOUNTER — Other Ambulatory Visit: Payer: Self-pay | Admitting: Family Medicine

## 2017-05-27 DIAGNOSIS — E559 Vitamin D deficiency, unspecified: Secondary | ICD-10-CM

## 2017-05-27 DIAGNOSIS — Z3482 Encounter for supervision of other normal pregnancy, second trimester: Secondary | ICD-10-CM

## 2017-05-27 LAB — CERVICOVAGINAL ANCILLARY ONLY
CHLAMYDIA, DNA PROBE: NEGATIVE
NEISSERIA GONORRHEA: NEGATIVE

## 2017-05-27 LAB — GLUCOSE, POCT (MANUAL RESULT ENTRY): POC GLUCOSE: 86 mg/dL (ref 70–99)

## 2017-05-28 LAB — GESTATIONAL GLUCOSE TOLERANCE
GLUCOSE 2 HOUR GTT: 87 mg/dL (ref 65–154)
Glucose, Fasting: 76 mg/dL (ref 65–94)
Glucose, GTT - 1 Hour: 102 mg/dL (ref 65–179)
Glucose, GTT - 3 Hour: 79 mg/dL (ref 65–139)

## 2017-05-29 ENCOUNTER — Encounter: Payer: Self-pay | Admitting: Family Medicine

## 2017-06-13 ENCOUNTER — Encounter (HOSPITAL_COMMUNITY): Payer: Self-pay

## 2017-06-14 ENCOUNTER — Ambulatory Visit (HOSPITAL_COMMUNITY): Admission: RE | Admit: 2017-06-14 | Payer: 59 | Source: Ambulatory Visit

## 2017-06-17 ENCOUNTER — Other Ambulatory Visit: Payer: Self-pay | Admitting: Family Medicine

## 2017-06-17 ENCOUNTER — Ambulatory Visit (HOSPITAL_COMMUNITY)
Admission: RE | Admit: 2017-06-17 | Discharge: 2017-06-17 | Disposition: A | Discharge status: Home / Self Care | Payer: 59 | Source: Ambulatory Visit | Attending: Family Medicine | Admitting: Family Medicine

## 2017-06-17 ENCOUNTER — Encounter (HOSPITAL_COMMUNITY): Payer: Self-pay

## 2017-06-17 DIAGNOSIS — O09522 Supervision of elderly multigravida, second trimester: Secondary | ICD-10-CM | POA: Insufficient documentation

## 2017-06-17 DIAGNOSIS — Z3A19 19 weeks gestation of pregnancy: Secondary | ICD-10-CM | POA: Insufficient documentation

## 2017-06-17 DIAGNOSIS — Z363 Encounter for antenatal screening for malformations: Secondary | ICD-10-CM | POA: Insufficient documentation

## 2017-06-17 DIAGNOSIS — Z3480 Encounter for supervision of other normal pregnancy, unspecified trimester: Secondary | ICD-10-CM

## 2017-06-17 DIAGNOSIS — O34219 Maternal care for unspecified type scar from previous cesarean delivery: Secondary | ICD-10-CM | POA: Insufficient documentation

## 2017-06-17 LAB — QUAD SCREEN FOR MFM

## 2017-06-17 IMAGING — US US MFM OB DETAIL+14 WK
1 series · 13 of 28 positions shown · non-contrast
Comparison: none

Indications

19 weeks gestation of pregnancy
Encounter for antenatal screening for          [O0]
malformations
Advanced maternal age multigravida 35+,        [O0]
second trimester
History of cesarean delivery, currently        [O0]
pregnant
OB History
Gravidity:    3         Term:   2
Living:       2
Fetal Evaluation
Num Of Fetuses:     1
Fetal Heart         142
Rate(bpm):
Cardiac Activity:   Observed
Presentation:       Cephalic
Placenta:           Posterior, above cervical os
P. Cord Insertion:  Visualized, central
Amniotic Fluid
AFI FV:      Subjectively within normal limits
Largest Pocket(cm)
5.81
Biometry
BPD:      43.7  mm     G. Age:  19w 1d         47  %    CI:        71.33   %    70 - 86
FL/HC:      18.1   %    16.1 -
HC:      164.8  mm     G. Age:  19w 1d         39  %    HC/AC:      1.13        1.09 -
AC:      145.3  mm     G. Age:  19w 6d         64  %    FL/BPD:     68.2   %
FL:       29.8  mm     G. Age:  19w 2d         40  %    FL/AC:      20.5   %    20 - 24
HUM:        29  mm     G. Age:  19w 3d         55  %
Est. FW:     296  gm    0 lb 10 oz      50  %
Gestational Age
U/S Today:     19w 3d                                        EDD:   [DATE]
Best:          19w 2d     Det. By:  Early Ultrasound         EDD:   [DATE]
([DATE])
Anatomy
Cranium:               Appears normal         LVOT:                   Appears normal
Cavum:                 Appears normal         Aortic Arch:            Appears normal
Ventricles:            Appears normal         Ductal Arch:            Appears normal
Choroid Plexus:        Appears normal         Diaphragm:              Appears normal
Cerebellum:            Appears normal         Stomach:                Appears normal, left
sided
Posterior Fossa:       Appears normal         Abdomen:                Appears normal
Nuchal Fold:           Appears normal         Abdominal Wall:         Appears nml (cord
insert, abd wall)
Face:                  Appears normal         Cord Vessels:           Appears normal (3
(orbits and profile)                           vessel cord)
Lips:                  Appears normal         Kidneys:                Appear normal
Palate:                Appears normal         Bladder:                Appears normal
Thoracic:              Appears normal         Spine:                  Appears normal
Heart:                 Appears normal         Upper Extremities:      Appears normal
(4CH, axis, and
situs)
RVOT:                  Appears normal         Lower Extremities:      Appears normal
Other:  Fetus appears to be a male.. Heels visualized. Technically difficult
due to fetal position.
Cervix Uterus Adnexa
Cervix
Length:           3.07  cm.
Normal appearance by transabdominal scan.
Uterus
No abnormality visualized.
Left Ovary
Not visualized.
Right Ovary
Adnexa:       No abnormality visualized. No adnexal mass
visualized.
Impression
INDICATION: 37 yr old [O0] at [O0] for fetal anatomic
survey.

[Series 1: us mfm ob detail+14 wk · 13 of 157 slices shown]
[im 6/157]
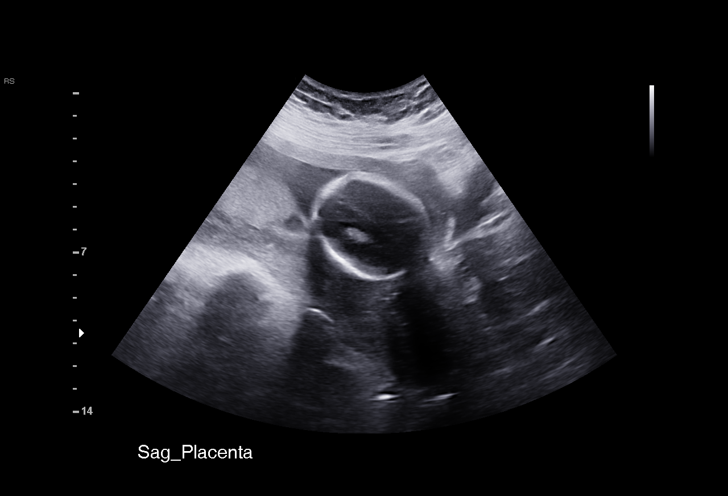
[im 18/157]
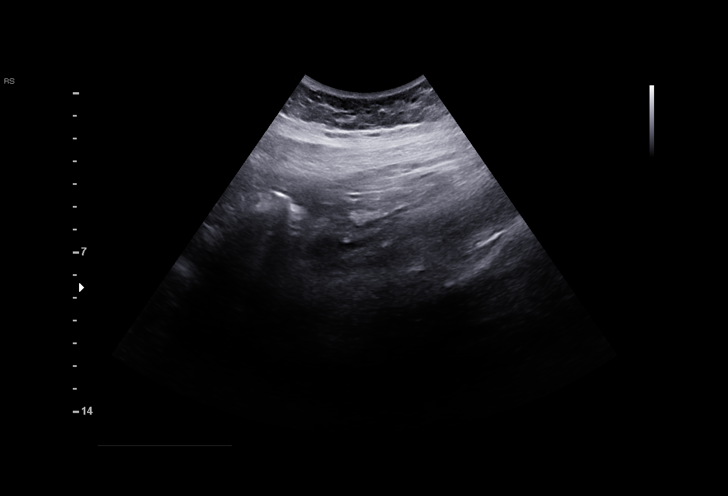
[im 29/157]
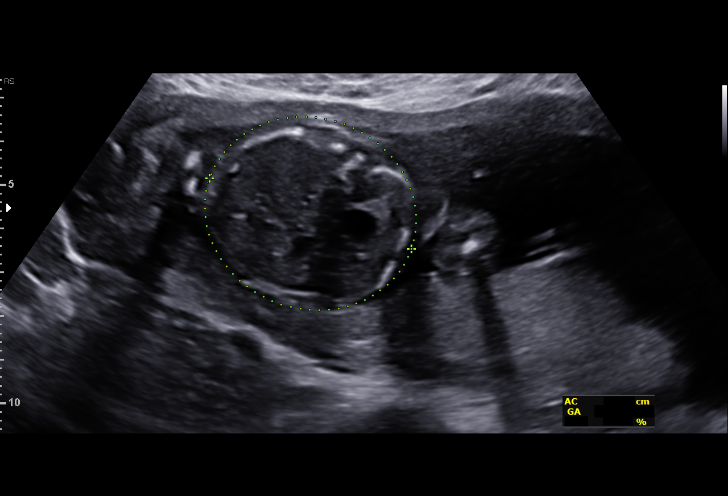
[im 41/157]
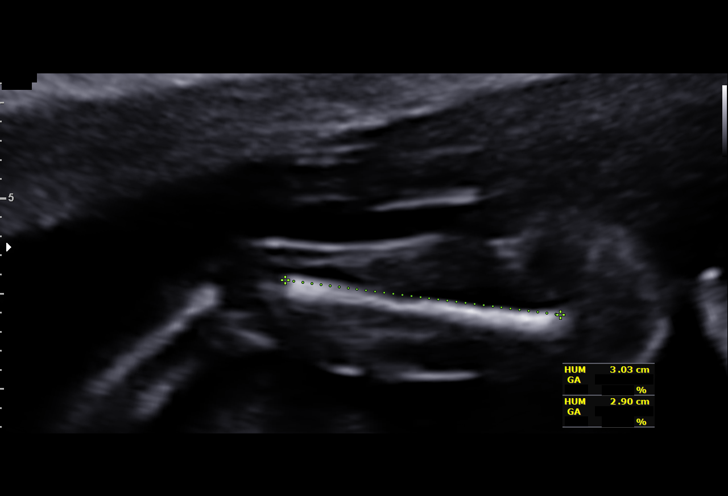
[im 53/157]
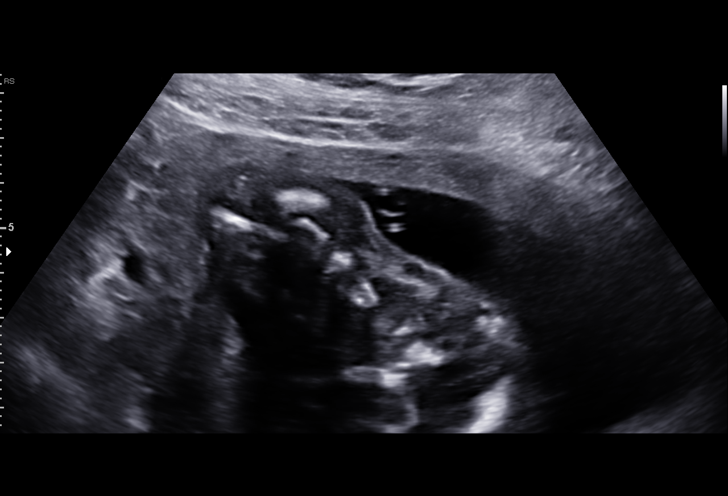
[im 64/157]
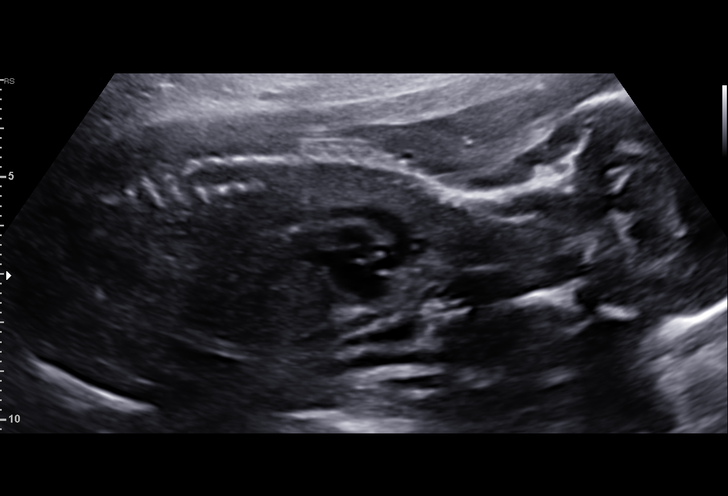
[im 81/157]
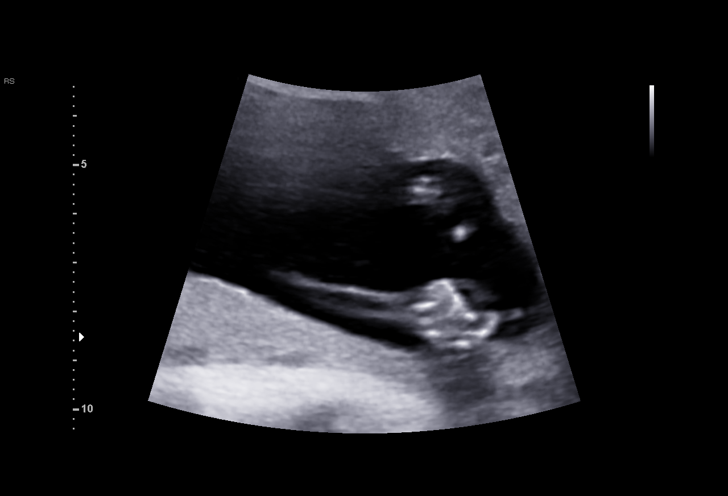
[im 93/157]
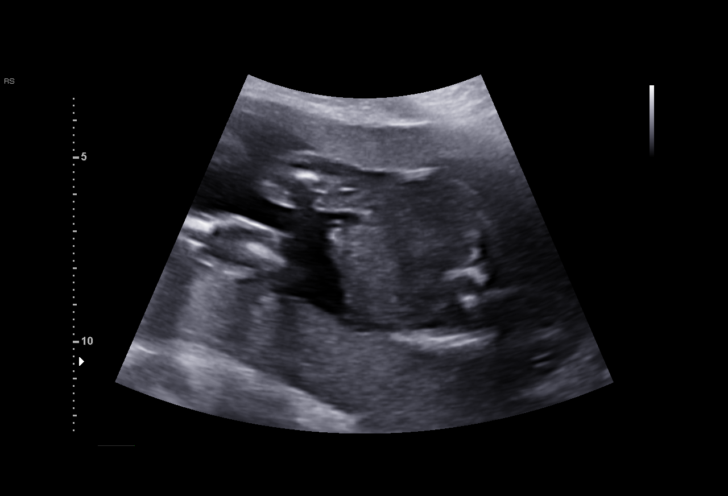
[im 105/157]
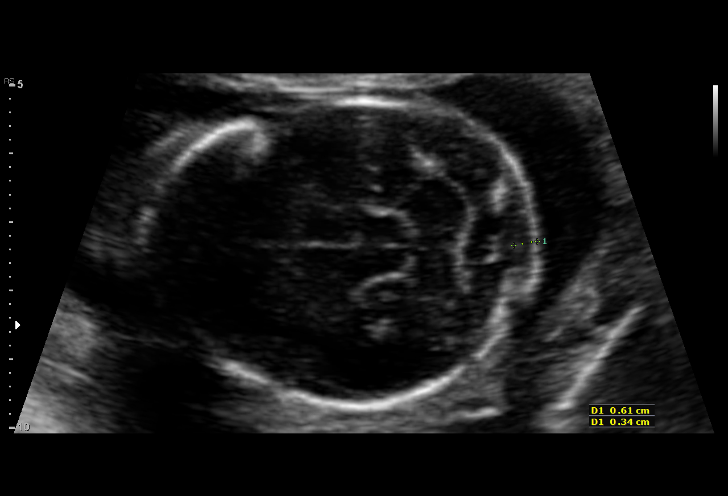
[im 116/157]
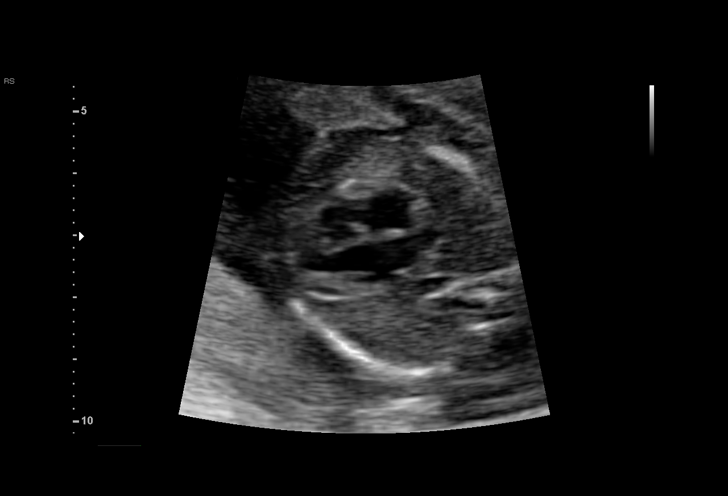
[im 128/157]
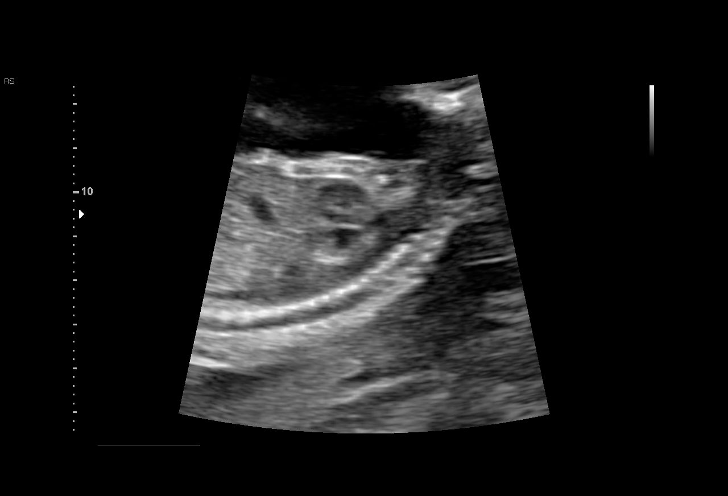
[im 139/157]
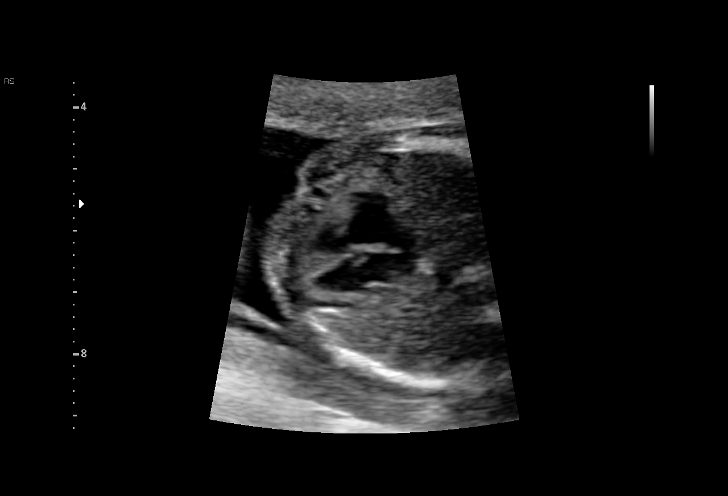
[im 151/157]
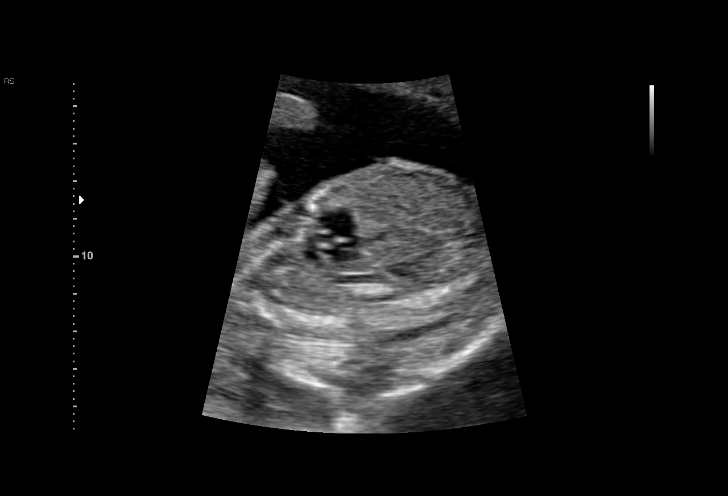

[13 of 28 positions shown; findings below may reference images not displayed]

FINDINGS: 1. Single intrauterine pregnancy with normal cardiac activity.
2. Fetal biometry is consistent with dating.
3. Posterior placenta without evidence of previa.
4. Normal amniotic fluid volume.
5. Normal transabdominal cervical length.
6. No adnexal masses seen.
7. Fetus is in cephalic presentation.
8. Normal fetal anatomic survey.
Recommendations

1. Appropriate fetal growth.
2. Normal fetal anatomic survey.
3. Advanced maternal age:
- patient previously counseled by primary OB and thought
she had the quad screen done with primary OB but order had
been cancelled- drawn today
4. Recommend follow up ultrasounds as clinically indicated

## 2017-06-17 NOTE — ED Notes (Signed)
Eda Royal present as interpreter. 

## 2017-06-18 ENCOUNTER — Other Ambulatory Visit (HOSPITAL_COMMUNITY): Payer: Self-pay | Admitting: *Deleted

## 2017-06-18 DIAGNOSIS — Z362 Encounter for other antenatal screening follow-up: Secondary | ICD-10-CM

## 2017-06-19 ENCOUNTER — Other Ambulatory Visit: Payer: Self-pay

## 2017-06-20 ENCOUNTER — Other Ambulatory Visit: Payer: Self-pay

## 2017-06-20 ENCOUNTER — Ambulatory Visit (INDEPENDENT_AMBULATORY_CARE_PROVIDER_SITE_OTHER): Payer: 59 | Admitting: Family Medicine

## 2017-06-20 VITALS — BP 98/58 | HR 76 | Temp 98.2°F | Wt 194.0 lb

## 2017-06-20 DIAGNOSIS — Z3480 Encounter for supervision of other normal pregnancy, unspecified trimester: Secondary | ICD-10-CM

## 2017-06-20 DIAGNOSIS — O09529 Supervision of elderly multigravida, unspecified trimester: Secondary | ICD-10-CM | POA: Insufficient documentation

## 2017-06-20 DIAGNOSIS — O09522 Supervision of elderly multigravida, second trimester: Secondary | ICD-10-CM

## 2017-06-20 DIAGNOSIS — Z3482 Encounter for supervision of other normal pregnancy, second trimester: Secondary | ICD-10-CM

## 2017-06-20 DIAGNOSIS — O34219 Maternal care for unspecified type scar from previous cesarean delivery: Secondary | ICD-10-CM

## 2017-06-20 DIAGNOSIS — E559 Vitamin D deficiency, unspecified: Secondary | ICD-10-CM

## 2017-06-20 MED ORDER — CHOLECALCIFEROL 25 MCG (1000 UT) PO TABS: 1000.0000 [IU] | ORAL_TABLET | Freq: Every day | ORAL | 1 refills | Status: DC

## 2017-06-20 NOTE — Progress Notes (Signed)
[facility] Visit  Amber HaymakerMaria J Fuentes is a 37 y.o. [id number] at 4743w5d (via 8w ultrasound) who [facility] for routine follow up. Prenatal course, history, notes, ultrasounds, and laboratory results reviewed.  Denies regularly occurring cramping/ctx, fluid leaking, vaginal bleeding, or decreased fetal movement. Has occasional sharp pains on sides in lower abdomen, nothing occurring regularly.Taking PNV.    FHR 152 bpm, fundal heigh 20cm  Primary Prenatal Care Provider: Dr. Darin EngelsAbraham  Pregnancy issues include:  1. Vitamin D deficiency - vit D checked with initial labs and was 20.9. Discussed repletion with OB/GYN attending Dr. Macon LargeAnyanwu who advised 1000IU vitamin D daily. - sent rx for 1000 units cholecalciferol daily - asked patient to call us with the brand of her PNV and dose of vit D in the PNV so we can be sure she is not getting too much vit D supplementation - plan to recheck vit D with 28 week labs.  2. History of cesarean section - patient desires TOLAC. Will need visit with Dr. Shawn[facility] PonsPratt at 30-[facility]w [facility]  3. Advanced maternal age - normal anatomy scan, has had quad screen drawn 3 days ago, await results. Offered referral to MFM genetics, patient agreeable to referral - referral to MFM genetics for AMA  4. Routine care: - plans to breast & formula feed - unsure regarding contraception choice, reviewed options that are compatible with breastfeeding with patient today, encouraged her to consider options. She does desire future pregnancies. - having a boy, declines circumcision - [facility] will [facility], other kids go there  Next prenatal visit in 4 weeks. Labor & fetal movement precautions discussed.  Levert FeinsteinBrittany Tsugio Elison, MD [facility]

## 2017-06-20 NOTE — Patient Instructions (Addendum)
Sent in vitamin D pill for you to take daily Referring to geneticist, someone will call with an appointment.  Next prenatal visit in 4 weeks.  If you have any cramping/contractions, vaginal bleeding, fluid leaking, or are worried that baby is not moving normally, [facility] to [facility].   Be well, Dr. Dolphus JennyMcIntyre    Segundo trimestre de Vanetta Muldersembarazo (Second Trimester of Pregnancy) El segundo trimestre va desde la semana13 hasta la 28, desde el cuarto hasta el sexto mes, y suele ser el momento en el que mejor se siente. En general, las nuseas matutinas han disminuido o han desaparecido completamente. Tendr ms energa y podr aumentarle el apetito. El beb por nacer (feto) se desarrolla rpidamente. Hacia el final del sexto mes, el beb mide aproximadamente 9 pulgadas (23 cm) y pesa alrededor de 1 libras (700 g). Es probable que sienta al beb moverse (dar pataditas) entre las 18 y 20 semanas del Psychiatristembarazo. CUIDADOS EN EL HOGAR  No fume, no consuma hierbas ni beba alcohol. No tome frmacos que el mdico no haya autorizado.  No consuma ningn producto que contenga tabaco, lo que incluye cigarrillos, tabaco de Theatre managermascar o Administrator, Civil Servicecigarrillos electrnicos. Si necesita ayuda para dejar de fumar, consulte al American Expressmdico. Puede recibir asesoramiento u otro tipo de apoyo para dejar de fumar.  Tome los medicamentos solamente como se lo haya indicado el mdico. Algunos medicamentos son seguros para tomar durante el Psychiatristembarazo y otros no lo son.  Haga ejercicios solamente como se lo haya indicado el mdico. Interrumpa la actividad fsica si comienza a tener calambres.  Ingiera alimentos saludables de [place]era regular.  Use un sostn que le brinde buen soporte si sus mamas estn sensibles.  No se d baos de inmersin en agua caliente, baos turcos ni saunas.  Colquese el cinturn de seguridad cuando conduzca.  No coma carne cruda ni queso sin cocinar; evite el contacto con las bandejas  sanitarias de los gatos y la tierra que estos animales usan.  Tome las vitaminas prenatales.  Tome entre 1500 y 2000mg  de calcio diariamente comenzando en la semana20 del embarazo [place] el parto.  Pruebe tomar un medicamento que la ayude a defecar (un laxante suave) si el mdico lo autoriza. Consuma ms fibra, que se encuentra en las frutas y verduras frescas y los cereales integrales. Beba suficiente lquido para mantener el pis (orina) claro o de color amarillo plido.  Dese baos de asiento con agua tibia para Engineer, materialsaliviar el dolor o las molestias causadas por las hemorroides. Use una crema para las hemorroides si el mdico la autoriza.  Si se le hinchan las venas (venas varicosas), use medias de descanso. Levante (eleve) los pies durante 15minutos, 3 o 4veces por Futures traderda. Limite el consumo de sal en su dieta.  No levante objetos pesados, use zapatos de tacones bajos y sintese derecha.  Descanse con las piernas elevadas si tiene calambres o dolor de cintura.  Visite a su dentista si no lo ha Occupational hygienisthecho durante el embarazo. Use un cepillo de cerdas suaves para cepillarse los dientes. Psese el hilo dental con suavidad.  Puede seguir Calpine Corporationmanteniendo relaciones sexuales, a menos que el mdico le indique lo contrario.  Concurra a los controles mdicos.  SOLICITE AYUDA SI:  Siente mareos.  Sufre calambres o presin leves en la parte baja del vientre (abdomen).  Sufre un dolor persistente en el abdomen.  Tiene Programme researcher, broadcasting/film/videomalestar estomacal (nuseas), vmitos, o tiene deposiciones acuosas (diarrea).  Advierte un olor ftido que proviene de la vagina.  Siente dolor al [employer].  SOLICITE AYUDA DE INMEDIATO SI:  Tiene fiebre.  Tiene una prdida de lquido por la vagina.  Tiene sangrado o pequeas prdidas vaginales.  Siente dolor intenso o clicos en el abdomen.  Sube o baja de peso rpidamente.  Tiene dificultades para recuperar el aliento y siente dolor en el pecho.  Sbitamente se le hinchan  mucho el rostro, las [place], los tobillos, los pies o las piernas.  No ha sentido los movimientos del beb durante Georgianne Fick.  Siente un dolor de cabeza intenso que no se alivia con medicamentos.  Su visin se modifica.  Esta informacin no tiene Theme park manager el consejo del mdico. Asegrese de hacerle al mdico cualquier pregunta que tenga. Document Released: 10/22/2012 Document Revised: 03/12/2014 Document Reviewed: 04/22/2012 Elsevier Interactive Patient Education  2017 [employer].

## 2017-06-25 ENCOUNTER — Telehealth: Payer: Self-pay

## 2017-06-25 NOTE — Telephone Encounter (Signed)
Patient's husband left message that patient's prenatal vitamin is PrePlus brand. Did not have bottle with him at work to look at label but will call back when he gets home.   Search for this vitamin lists Vitamin D amount as 400 IU.  Callback is [phone] to husband, Regan RakersRoberto.  Ples SpecterAlisa Knox Cervi, RN [facility] RN)

## 2017-06-26 ENCOUNTER — Telehealth: Payer: Self-pay | Admitting: Family Medicine

## 2017-06-26 NOTE — Telephone Encounter (Signed)
Patient's husband came by office to show RX Preplus tablets. (oval beige tablet side 1: V258)  This is RX where the question about the Vit. D was discussed. If any questions call patient at [phone].

## 2017-06-26 NOTE — Telephone Encounter (Signed)
This tablet has 400 IU of vitamin D. This is fine for patient to take with the 1000IU supplement. Please let patient know she can stay on this vitamin along with the vitamin D supplement I sent in last week.  Thanks! Latrelle DodrillBrittany J Ryker [place], MD

## 2017-06-26 NOTE — Telephone Encounter (Signed)
Called patient via Renne CriglerRebecca ID # [phone] [facility] to inquire as to how much vitamin D the patient is taking or will be taking.  There was no answer so a voice mail message was left for the patient to call back and clarify this information on the consumption of vitamin D supplement.  Glennie Hawk.Simpson, Michelle R, CMA

## 2017-06-26 NOTE — Telephone Encounter (Signed)
Handled via another encounter, see phone note from today 4/25. Latrelle DodrillBrittany J Sharbel Sahagun, MD

## 2017-06-26 NOTE — Telephone Encounter (Signed)
Called and left voice message to inform patient that it is fine to take Vitamin D supplement on hand per Dr. Pollie MeyerMcIntyre. Glennie Hawk.Hideko Esselman R, CMA

## 2017-07-16 ENCOUNTER — Encounter (HOSPITAL_COMMUNITY): Payer: Self-pay

## 2017-07-16 ENCOUNTER — Ambulatory Visit (HOSPITAL_COMMUNITY)
Admission: RE | Admit: 2017-07-16 | Discharge: 2017-07-16 | Disposition: A | Discharge status: Home / Self Care | Payer: 59 | Source: Ambulatory Visit | Attending: Family Medicine | Admitting: Family Medicine

## 2017-07-16 DIAGNOSIS — O09522 Supervision of elderly multigravida, second trimester: Secondary | ICD-10-CM | POA: Diagnosis not present

## 2017-07-16 DIAGNOSIS — O34219 Maternal care for unspecified type scar from previous cesarean delivery: Secondary | ICD-10-CM | POA: Diagnosis not present

## 2017-07-16 DIAGNOSIS — Z3A23 23 weeks gestation of pregnancy: Secondary | ICD-10-CM | POA: Diagnosis not present

## 2017-07-16 DIAGNOSIS — Z3480 Encounter for supervision of other normal pregnancy, unspecified trimester: Secondary | ICD-10-CM

## 2017-07-16 DIAGNOSIS — Z362 Encounter for other antenatal screening follow-up: Secondary | ICD-10-CM | POA: Diagnosis present

## 2017-07-16 IMAGING — US US MFM OB FOLLOW-UP
1 series · 14 of 28 positions shown · non-contrast
Comparison: none

[Series 1: us mfm ob follow-up · 70 acquisitions, 14 frames shown]
[im 3/70]
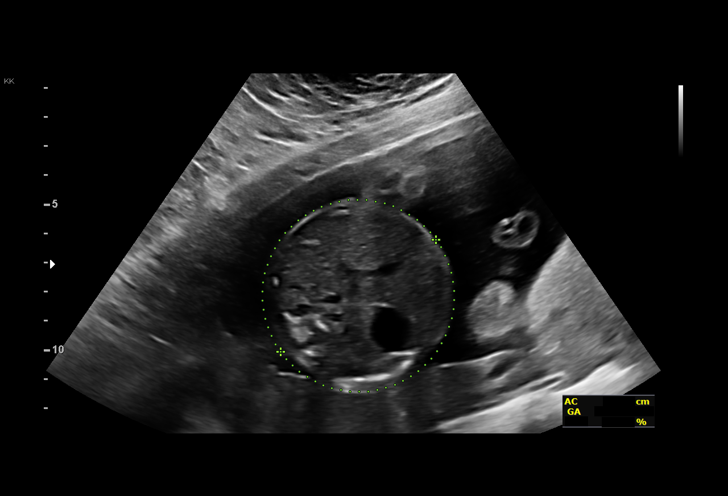
[im 8/70]
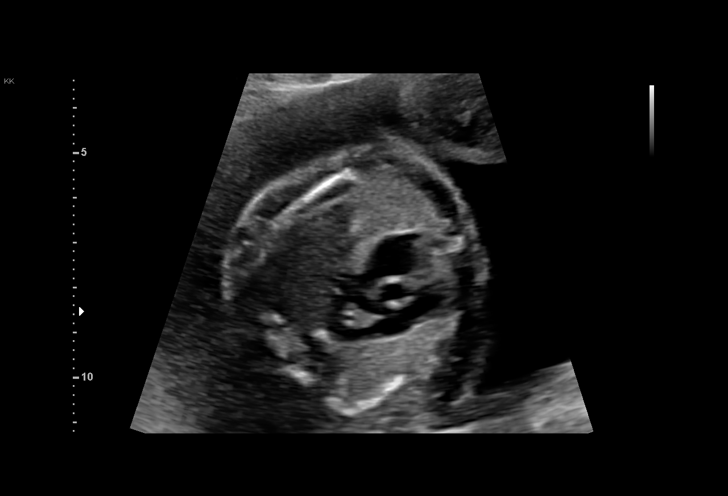
[im 13/70]
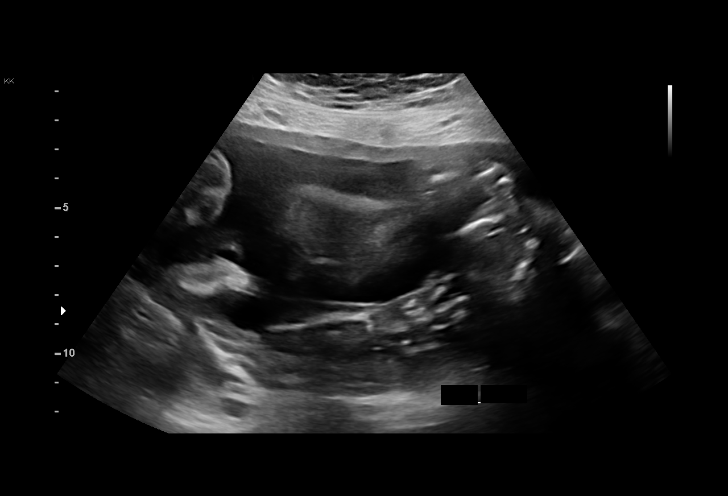
[im 18/70]
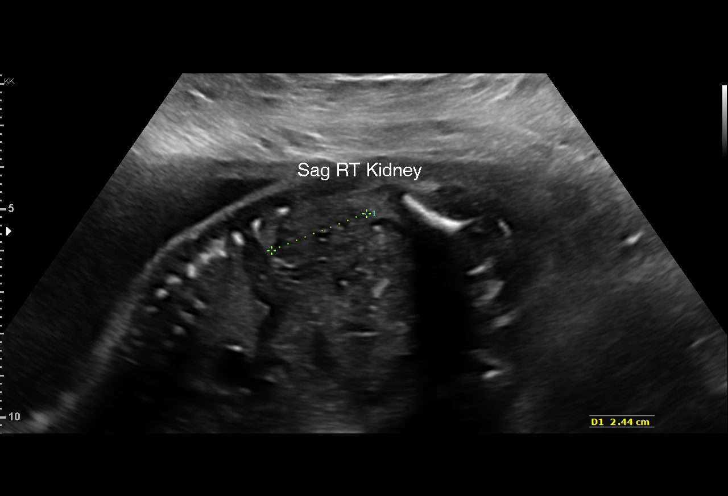
[im 24/70]
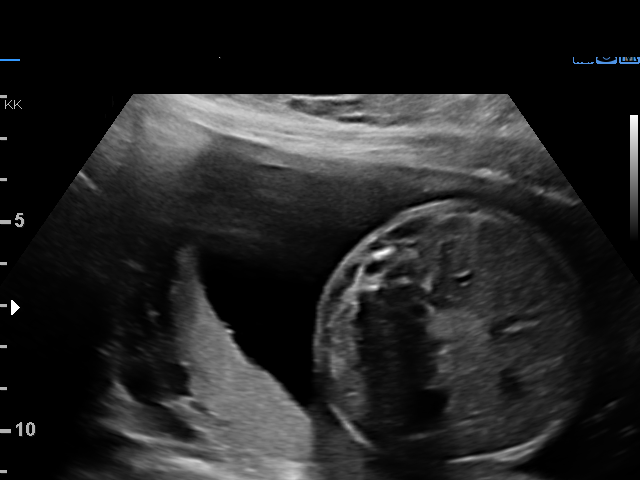
[im 29/70]
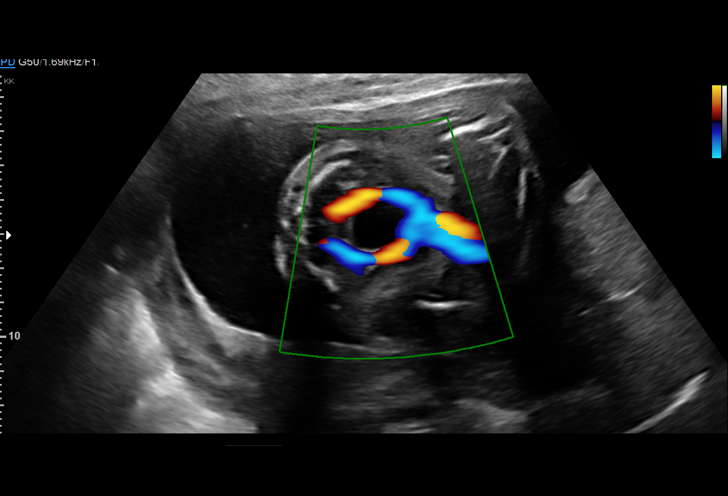
[im 34/70]
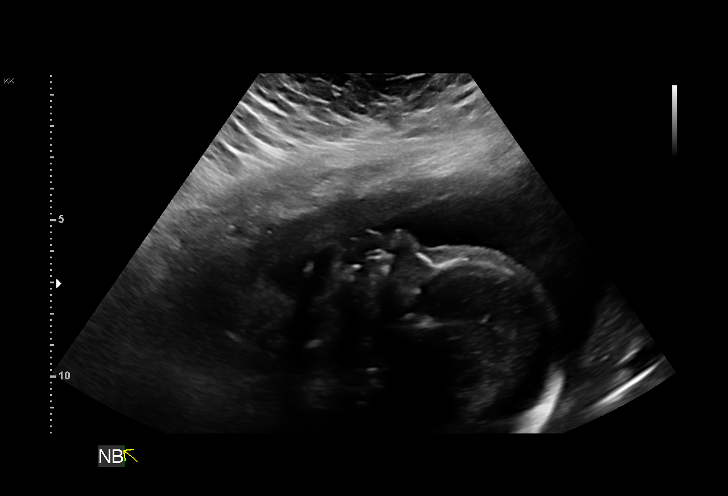
[im 39/70]
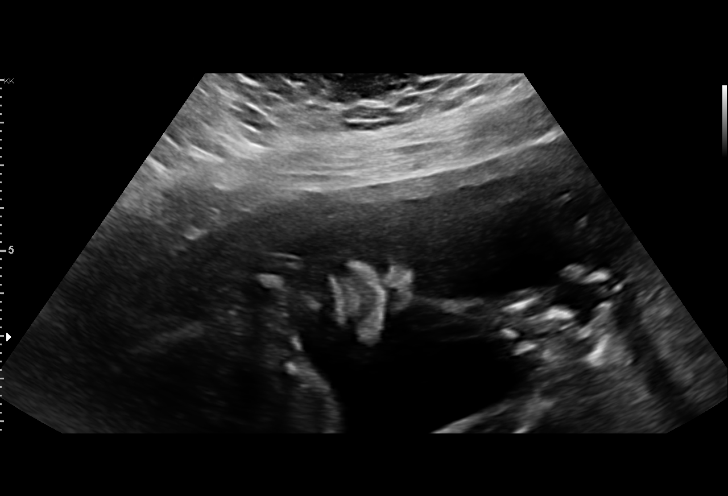
[im 44/70]
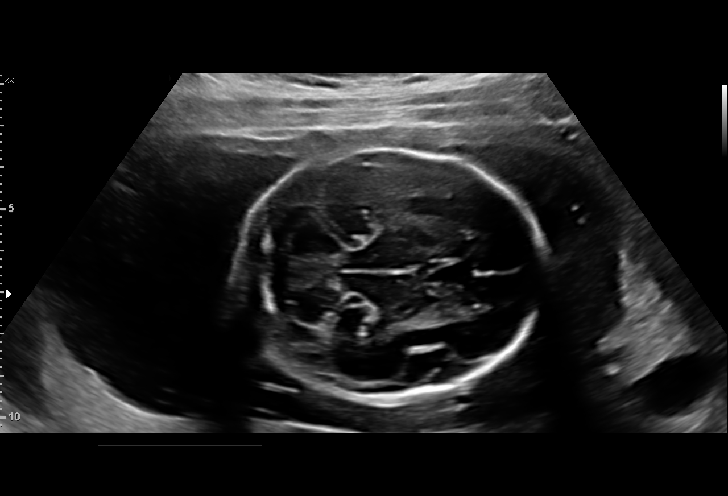
[im 49/70]
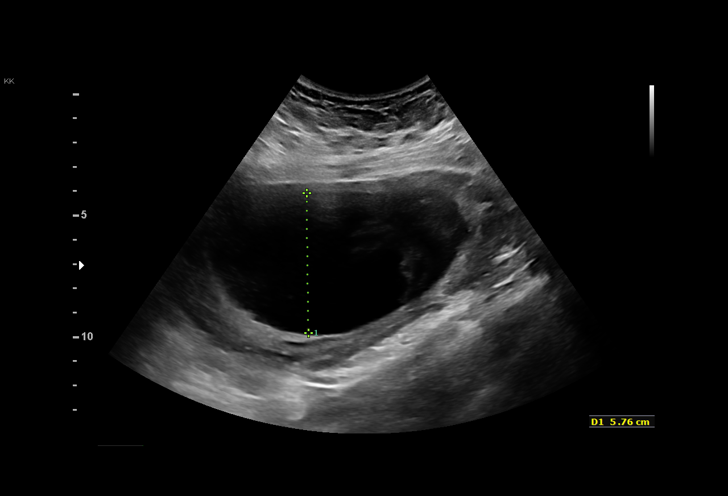
[im 54/70]
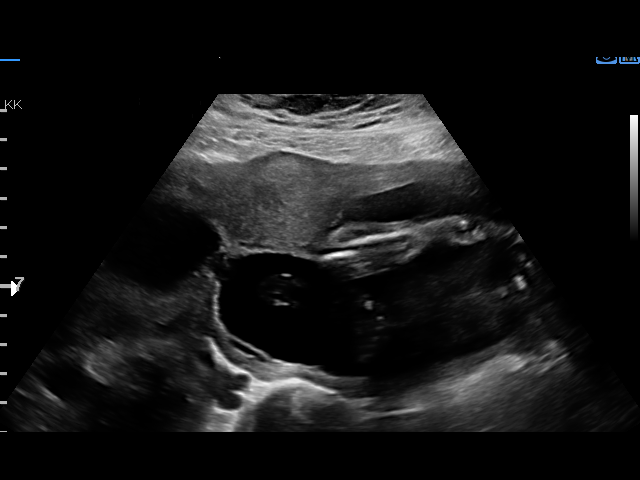
[im 59/70]
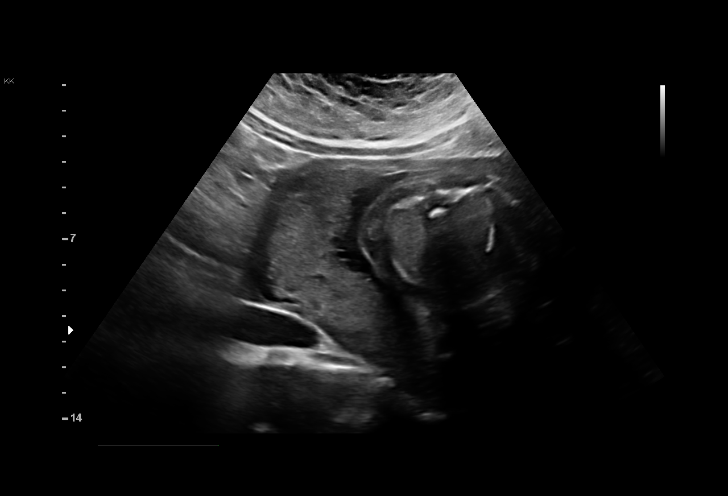
[im 64/70]
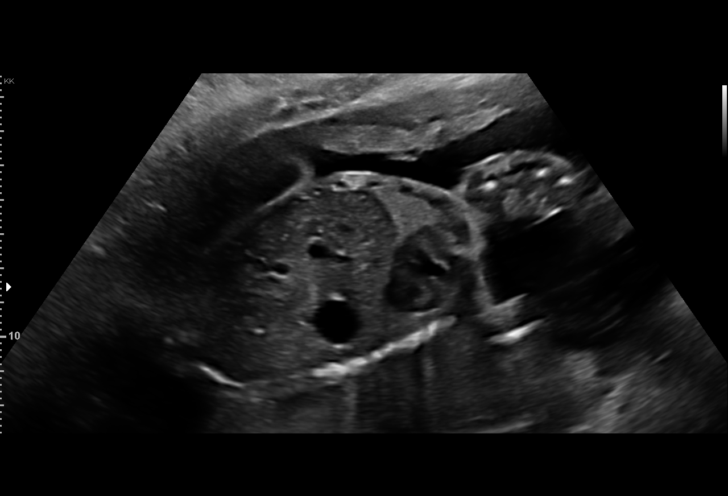
[im 70/70]
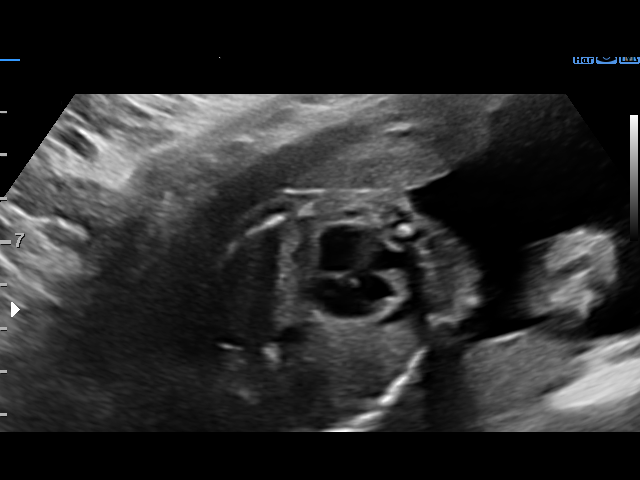

[14 of 28 positions shown; findings below may reference images not displayed]

1  CHARALAMBIA SAPARILLA            [id number]      [id number]     [id number]
Indications

23 weeks gestation of pregnancy
Advanced maternal age multigravida 35+,
second trimester; neg quad screen
History of cesarean delivery, currently
pregnant
Encounter for other antenatal screening
follow-up
OB History

Blood Type:            Height:  5'4"   Weight (lb):  194       BMI:
Gravidity:    3         Term:   2
Living:       2
Fetal Evaluation

Num Of Fetuses:     1
Fetal Heart         153
Rate(bpm):
Cardiac Activity:   Observed
Presentation:       Variable
Placenta:           Posterior, above cervical os
P. Cord Insertion:  Previously Visualized

Amniotic Fluid
AFI FV:      Subjectively within normal limits

Largest Pocket(cm)
5.8
Biometry

BPD:      59.7  mm     G. Age:  24w 3d         78  %    CI:        73.65   %    70 - 86
FL/HC:      18.9   %    19.2 -
HC:       221   mm     G. Age:  24w 1d         62  %    HC/AC:      1.05        1.05 -
AC:      209.6  mm     G. Age:  25w 4d         93  %    FL/BPD:     69.8   %    71 - 87
FL:       41.7  mm     G. Age:  23w 4d         43  %    FL/AC:      19.9   %    20 - 24

Est. FW:     715  gm      1 lb 9 oz     73  %
Gestational Age

U/S Today:     24w 3d                                        EDD:   11/02/17
Best:          23w 3d     Det. By:  Early Ultrasound         EDD:   11/09/17
(04/03/17)
Anatomy

Cranium:               Appears normal         LVOT:                   Previously seen
Cavum:                 Appears normal         Aortic Arch:            Appears normal
Ventricles:            Appears normal         Ductal Arch:            Previously seen
Choroid Plexus:        Previously seen        Diaphragm:              Appears normal
Cerebellum:            Appears normal         Stomach:                Appears normal, left
sided
Posterior Fossa:       Appears normal         Abdomen:                Appears normal
Nuchal Fold:           Previously seen        Abdominal Wall:         Previously seen
Face:                  Appears normal         Cord Vessels:           Appears normal (3
(orbits and profile)                           vessel cord)
Lips:                  Appears normal         Kidneys:                Appear normal
Palate:                Previously seen        Bladder:                Appears normal
Thoracic:              Appears normal         Spine:                  Appears normal
Heart:                 Appears normal         Upper Extremities:      Previously seen
(4CH, axis, and
situs)
RVOT:                  Appears normal         Lower Extremities:      Previously seen

Other:  Male gender. Heels previously seen. Open hands visualized.
Cervix Uterus Adnexa

Cervix
Length:            3.5  cm.
Normal appearance by transabdominal scan.
Impression

SIUP at 23+3 weeks
Normal interval anatomy; anatomic survey complete
Normal amniotic fluid volume
Appropriate interval growth with EFW at the 73rd %tile
Recommendations

Consider growth US in the third trimester (AMA)

## 2017-07-17 ENCOUNTER — Other Ambulatory Visit: Payer: Self-pay

## 2017-07-22 ENCOUNTER — Ambulatory Visit (INDEPENDENT_AMBULATORY_CARE_PROVIDER_SITE_OTHER): Payer: 59 | Admitting: Internal Medicine

## 2017-07-22 ENCOUNTER — Encounter: Payer: Self-pay | Admitting: Internal Medicine

## 2017-07-22 VITALS — BP 96/60 | HR 82 | Temp 98.7°F | Wt 199.2 lb

## 2017-07-22 DIAGNOSIS — Z3482 Encounter for supervision of other normal pregnancy, second trimester: Secondary | ICD-10-CM

## 2017-07-22 MED ORDER — CHOLECALCIFEROL 25 MCG (1000 UT) PO TABS: 1000.0000 [IU] | ORAL_TABLET | Freq: Every day | ORAL | 1 refills | Status: DC

## 2017-07-22 NOTE — Progress Notes (Signed)
Amber Fuentes is a 37 y.o. G3P2002 at [redacted]w[redacted]d for routine follow up. Visit assisted by Spanish video interpreter Ennis [phone]). She reports doing well with no contractions, no vaginal discharge, no dysuria, no vaginal bleeding. She does, however, note occasional pain under the right side of her ribs if she eats a large meal or drinks a large volume of water. This occurs maybe three times a week for the last 3 weeks. Denies itching, vision changes, nausea or vomiting.   See flow sheet for details.  A/P: Pregnancy at [redacted]w[redacted]d.  Doing well.   Pregnancy issues include: - vitamin D deficiency, taking 1000 units daily in addition to PNV (discussed with Dr. Macon Large previously). Plan for recheck vitamin D with 28 week labs.  - history of c-section and desires TOLAC. Will need visit with Dr. Shawnie Pons at 30-32 weeks at [facility]. - AMA, normal anatomy scan and quad screen. Patient would still like referral to MFM, which had been placed previously, but patient has not been contacted about appointment. Will look into status of referral. - RUQ, infrequent and intermittent. Could not elicit pain on exam. Recommended eating smaller, more frequent meals. Counseled that if she develops pruritis or has persistent pain will need to return for lab work (serum bile acid concentration and serum aminotransferases).   Routine care: - feeding: breast & formula - contraception: unsure, desires future pregnancies. - circumcision: declines - pediatrician: [facility], other kids go there  Anatomy scan reviewed, problems are not noted.  Preterm labor precautions reviewed. Will check into status of MFM referral. Reviewed negative quad screen with patient.  Follow up 4 weeks. Will be due for 1 hour GTT, CBC, RPR, HIV, vitamin D level and TDAP administration.   Dani Gobble, MD [facility], PGY-3

## 2017-07-22 NOTE — Patient Instructions (Signed)
Amber Fuentes,  Gracias por venir hoy. El ritmo cardaco de su beb suena perfecto, y su vientre est midiendo dnde debera.  Si presenta picazn o molestias abdominales ms frecuentes, devulvanos la llamada antes de su cita prenatal habitual de 4 semanas.  Continuar con las pastillas de vitamina D y la vitamina prenatal.  Llamaremos con estado de referencia gentica.  Mejor, Dr. Arbie Cookey gstrica durante el embarazo (Heartburn During Pregnancy) [name] se produce cuando el cido del estmago sube hacia el esfago. El esfago es el conducto que une la boca con el [place]. Este cido causa un dolor que quema en el pecho o la garganta. Esto ocurre ms frecuentemente en la etapa final del embarazo porque el vientre (tero) se agranda. Tambin puede producirse debido a cambios hormonales. Este problema generalmente desaparece despus del parto. CUIDADOS EN EL HOGAR  Tome todos los medicamentos segn las indicaciones de su mdico.  Eleve la cabecera de la cama con bloques segn le indique el mdico.  No haga ejercicios inmediatamente despus de comer.  Evite comer [employer] y treshoras antes de ir a Teacher, music. No se acueste inmediatamente despus de comer.  Haga comidas pequeas durante Glass blower/designer de 3 comidas abundantes.  Evite los alimentos que le hagan mal. Los alimentos que debe evitar son: ? [place]. ? Chocolate. ? Alimentos con alto contenido de grasas, incluyendo las comidas fritas ? Comidas muy condimentadas. ? Ajo y cebolla ? Ctricos, como naranjas, pomelos, limones y limas. ? Alimentos o productos que [employer]. ? Menta. ? Bebidas gaseosas (carbonatadas) y las que contengan cafena. ? Vinagre  SOLICITE AYUDA SI:  Siente dolor en el estmago (abdominal).  Siente ardor en la parte superior del estmago o el pecho, especialmente despus de comer o recostarse.  Tiene Programme researcher, broadcasting/film/video (nuseas) y vomita.  Tiene malestar estomacal  despus de comer.  SOLICITE AYUDA DE INMEDIATO SI:  Siente un dolor intenso en el pecho que baja por el brazo o va hacia la mandbula o el cuello.  Se siente mareado o sufre un desmayo.  Tiene dificultad para respirar.  Vomita sangre.  Tiene dificultad o dolor al tragar.  La materia fecal es negra o tiene [place].  Tiene acidez ms de 3 veces por semana, durante ms de 2 semanas.  ASEGRESE DE QUE:  Comprende estas instrucciones.  Controlar su afeccin.  Recibir ayuda de inmediato si no mejora o si empeora.  Esta informacin no tiene Theme park manager el consejo del mdico. Asegrese de hacerle al mdico cualquier pregunta que tenga. Document Released: 06/06/2010 Document Revised: 12/10/2012 Document Reviewed: 08/04/2015 Elsevier Interactive Patient Education  2017 [employer].

## 2017-08-19 ENCOUNTER — Ambulatory Visit (INDEPENDENT_AMBULATORY_CARE_PROVIDER_SITE_OTHER): Payer: 59 | Admitting: Internal Medicine

## 2017-08-19 VITALS — BP 100/60 | HR 91 | Temp 98.2°F | Wt 202.0 lb

## 2017-08-19 DIAGNOSIS — E559 Vitamin D deficiency, unspecified: Secondary | ICD-10-CM | POA: Diagnosis not present

## 2017-08-19 DIAGNOSIS — Z3493 Encounter for supervision of normal pregnancy, unspecified, third trimester: Secondary | ICD-10-CM

## 2017-08-19 DIAGNOSIS — Z3482 Encounter for supervision of other normal pregnancy, second trimester: Secondary | ICD-10-CM | POA: Diagnosis not present

## 2017-08-19 DIAGNOSIS — Z23 Encounter for immunization: Secondary | ICD-10-CM | POA: Diagnosis not present

## 2017-08-19 DIAGNOSIS — Z3480 Encounter for supervision of other normal pregnancy, unspecified trimester: Secondary | ICD-10-CM

## 2017-08-19 LAB — POCT 1 HR PRENATAL GLUCOSE: GLUCOSE 1 HR PRENATAL, POC: 158 mg/dL

## 2017-08-19 NOTE — Patient Instructions (Addendum)
[name], [place] por venir hoy.  Llamar con resultados de laboratorio.  Regrese en 2 semanas para hablar sobre la prueba de parto despus de haber tenido una cesrea con el Dr. Shawnie Fuentes. Si no est disponible, por favor regrese para una visita prenatal regular.  Mejor, Dr. Sampson Goon  Thank you for coming in today.  I will call with lab results.  Please return in 2 weeks to discuss trial of labor after having had C-section with Dr. Shawnie Fuentes. If she is not available, please return for regular prenatal visit.  Best,  Dr. Sampson Goon   Tercer trimestre de Amber Fuentes (Third Trimester of Pregnancy) El tercer trimestre comprende desde la semana29 [employer] semana42, es decir, desde el mes7 hasta el [address] En este trimestre, el feto crece muy rpido. Hacia el final del noveno mes, el feto mide alrededor de 20pulgadas (45cm) de largo y pesa entre 6y 10libras [phone]). CUIDADOS EN EL HOGAR  No fume, no consuma hierbas ni beba alcohol. No tome frmacos que el mdico no haya autorizado.  No consuma ningn producto que contenga tabaco, lo que incluye cigarrillos, tabaco de Theatre manager o Administrator, Civil Service. Si necesita ayuda para dejar de fumar, consulte al [employer]. Puede recibir asesoramiento u otro tipo de apoyo para dejar de fumar.  Tome los medicamentos solamente como se lo haya indicado el mdico. Algunos medicamentos son seguros para tomar durante el Psychiatrist y otros no lo son.  Haga ejercicios solamente como se lo haya indicado el mdico. Interrumpa la actividad fsica si comienza a tener calambres.  Ingiera alimentos saludables de [place] regular.  Use un sostn que le brinde buen soporte si sus mamas estn sensibles.  No se d baos de inmersin en agua caliente, baos turcos ni saunas.  Colquese el cinturn de seguridad cuando conduzca.  No coma carne cruda ni queso sin cocinar; evite el contacto con las bandejas sanitarias de los gatos y la tierra que estos  animales usan.  Tome las vitaminas prenatales.  Tome entre 1500 y 2000mg  de calcio diariamente comenzando en la semana20 del embarazo [place].  Pruebe tomar un medicamento que la ayude a defecar (un laxante suave) si el mdico lo autoriza. Consuma ms fibra, que se encuentra en las frutas y verduras frescas y los cereales integrales. Beba suficiente lquido para mantener el pis (orina) claro o de color amarillo plido.  Dese baos de asiento con agua tibia para Engineer, materials o las molestias causadas por las hemorroides. Use una crema para las hemorroides si el mdico la autoriza.  Si se le hinchan las venas (venas varicosas), use medias de descanso. Levante (eleve) los pies durante , 3 o 4veces por Futures trader. Limite el consumo de sal en su dieta.  No levante objetos pesados, use zapatos de tacones bajos y sintese derecha.  Descanse con las piernas elevadas si tiene calambres o dolor de cintura.  Visite a su dentista si no lo ha Occupational hygienist. Use un cepillo de cerdas suaves para cepillarse los dientes. Psese el hilo dental con suavidad.  Puede seguir [employer], a menos que el mdico le indique lo contrario.  No haga viajes de larga distancia, excepto si es obligatorio y solamente con la aprobacin del mdico.  Tome clases prenatales.  Practique ir manejando al hospital.  Prepare el bolso que llevar al hospital.  Prepare la habitacin del beb.  Concurra a los controles mdicos.  SOLICITE AYUDA SI:  No est segura de si est en [place]en Campi de  parto o si ha roto la bolsa de las aguas.  Tiene mareos.  Siente calambres leves o presin en la parte inferior del abdomen.  Sufre un dolor persistente en el abdomen.  Tiene Programme researcher, broadcasting/film/video (nuseas), vmitos, o tiene deposiciones acuosas (diarrea).  Advierte un olor ftido que proviene de la vagina.  Siente dolor al [employer].  SOLICITE AYUDA DE INMEDIATO SI:  Tiene  fiebre.  Tiene una prdida de lquido por la vagina.  Tiene sangrado o pequeas prdidas vaginales.  Siente dolor intenso o clicos en el abdomen.  Sube o baja de peso rpidamente.  Tiene dificultades para recuperar el aliento y siente dolor en el pecho.  Sbitamente se le hinchan mucho el rostro, las [place], los tobillos, los pies o las piernas.  No ha sentido los movimientos del beb durante Georgianne Fick.  Siente un dolor de cabeza intenso que no se alivia con medicamentos.  Su visin se modifica.  Esta informacin no tiene Theme park manager el consejo del mdico. Asegrese de hacerle al mdico cualquier pregunta que tenga. Document Released: 10/22/2012 Document Revised: 03/12/2014 Document Reviewed: 04/22/2012 Elsevier Interactive Patient Education  2017 [employer].   Informacin sobre parto y Passenger transport manager de parto prematuros Preterm Labor and Birth Information El embarazo tiene generalmente una duracin de 39 a 41 semanas. El [place] de parto es prematuro cuando se inicia muy pronto. Comienza antes de completar las 37 semanas de [place]. Cules son los factores de riesgo del [place] prematuro? Existen mayores probabilidades de trabajo de parto prematuro en mujeres con las siguientes caractersticas:  Tuvieron una infeccin Academic librarian.  El cuello uterino es corto.  Tuvieron trabajo de parto prematuro anteriormente.  Se sometieron a una ciruga en el cuello uterino.  Son menores de 17aos.  Tienen ms de 35aos.  Son afroamericanas.  Estn embarazadas de dos o ms bebs.  Consumen drogas mientras estn embarazadas.  Fuman mientras estn embarazadas.  No aumentan de peso lo suficiente durante el [employer].  Se embarazaron inmediatamente despus de Nurse, mental health.  Cules son los sntomas del Aleen Campi de [place] prematuro? Los sntomas del trabajo de parto prematuro incluyen lo siguiente:  Educational psychologist. Los calambres pueden parecerse a los que tiene  una mujer durante el perodo menstrual. Los calambres pueden presentarse con diarrea.  Dolor de vientre (abdomen).  Dolor en la zona lumbar.  Tiene contracciones regulares o endurecimiento del tero. Siente como si el vientre se endurece.  Presin en la zona inferior del vientre que Advertising account executive.  Pierde ms lquido (secrecin) por la vagina. El lquido puede ser acuoso o con [place].  Ruptura de la bolsa de aguas.  Por qu es importante notar los signos del [place] de [place] prematuro? Los bebs que nacen antes de tiempo pueden no estar completamente desarrollados. Estos pueden tener un riesgo mayor de padecer:  Problemas cardacos a Air cabin crew.  Problemas pulmonares a Air cabin crew.  Dificultades para controlar los sistemas corporales, por ejemplo, respirar.  Hemorragia cerebral.  Una afeccin que se denomina parlisis cerebral.  Dificultades en el aprendizaje.  Muerte.  Estos riesgos son [employer] para bebs que nacen antes de las 34semanas de [place]. Cmo se trata Kathie Dike de parto prematuro? El tratamiento depende de lo siguiente:  El tiempo de [place].  Su estado de [place].  La salud del beb.  El tratamiento puede incluir lo siguiente:  Un punto (sutura) en el cuello uterino. Al parir, el cuello uterino se abre para que el beb pueda salir. El punto  impide que el cuello uterino se abra antes de Rose Valleytiempo.  Permanecer en el hospital.  Tomar medicamentos como, por ejemplo: ? Medicamentos hormonales. ? Medicamentos para TEFL teacherdetener las contracciones. ? Medicamentos para ayudar a la maduracin de los pulmones del beb. ? Medicamentos para evitar que el beb desarrolle parlisis cerebral.  Qu debo hacer si estoy en Ihor Dowtrabajo de parto prematuro? Si cree que est en trabajo de parto demasiado pronto, llame a su mdico de inmediato. Cmo puedo prevenir el trabajo de parto prematuro?  No use productos que contengan tabaco. ? Estos incluyen cigarrillos,  tabaco para Theatre managermascar y Administrator, Civil Servicecigarrillos electrnicos. ? Si necesita ayuda para dejar de fumar, consulte al mdico.  No consuma drogas.  No tome ningn medicamento si el mdico no se lo indic.  Consulte al mdico antes de empezar a tomar cualquier suplemento de hierbas.  Asegrese aumentar de peso como corresponde.  Tenga cuidado con las infecciones. Si cree que puede tener una infeccin, consulte al mdico para que la revisen inmediatamente.  Infrmele al mdico si ha tenido trabajo de parto prematuro anteriormente. Esta informacin no tiene Theme park managercomo fin reemplazar el consejo del mdico. Asegrese de hacerle al mdico cualquier pregunta que tenga. Document Released: 03/24/2010 Document Revised: 05/30/2016 Document Reviewed: 07/13/2015 Elsevier Interactive Patient Education  [employer]2018 Elsevier Inc.

## 2017-08-19 NOTE — Progress Notes (Signed)
Amber HaymakerMaria J Monge-Reyes is a 37 y.o. G3P2002 at 2441w2d for routine follow up.  She is accompanied by her husband. Visit assisted by Spanish video interpreter. She reports no vaginal bleeding, no dysuria, no vaginal discharge. She notes frequent fetal movement. She reports concern of brief (a couple seconds) contractions across the top of her abdomen multiple times overnight last night and some today. Does not have multiple episodes per hour. RUQ pain episodes discussed last visit resolved. These pains feel like contractions.    See flow sheet for details.  A/P: Pregnancy at 7341w2d.  Doing well.   Pregnancy issues include: - vitamin D deficiency, taking 1000 units daily in addition to PNV (discussed with Dr. Macon LargeAnyanwu previously). - history of c-section and desires TOLAC. Will need [facility] with [facility] - AMA, normal anatomy scan and quad screen.   Infant feeding choice: breast and bottle Contraception choice: undecided  Infant circumcision desired no  Tdapwas given today. 1 hour glucola, CBC, RPR, and HIV were done today. Vitamin D level also obtained.   RH status was reviewed and pt does not need Rhogam.  Rhogam was not given today.   Preterm labor precautions reviewed. Discussed and provided handout on Braxton Hicks contractions.  Kick counts reviewed. Follow up 2 weeks with Dr. Shawnie PonsPratt to discuss TOLAC.   Dani GobbleHillary Halima Fogal, MD [facility], PGY-3

## 2017-08-20 LAB — CBC
Hematocrit: 35.3 % (ref 34.0–46.6)
Hemoglobin: 11 g/dL — ABNORMAL LOW (ref 11.1–15.9)
MCH: 26.7 pg (ref 26.6–33.0)
MCHC: 31.2 g/dL — ABNORMAL LOW (ref 31.5–35.7)
MCV: 86 fL (ref 79–97)
PLATELETS: 295 10*3/uL (ref 150–450)
RBC: 4.12 x10E6/uL (ref 3.77–5.28)
RDW: 15.4 % (ref 12.3–15.4)
WBC: 9.3 10*3/uL (ref 3.4–10.8)

## 2017-08-20 LAB — VITAMIN D 25 HYDROXY (VIT D DEFICIENCY, FRACTURES): VIT D 25 HYDROXY: 30.6 ng/mL (ref 30.0–100.0)

## 2017-08-20 LAB — HIV ANTIBODY (ROUTINE TESTING W REFLEX): HIV Screen 4th Generation wRfx: NONREACTIVE

## 2017-08-20 LAB — RPR: RPR: NONREACTIVE

## 2017-08-21 ENCOUNTER — Encounter: Payer: Self-pay | Admitting: Internal Medicine

## 2017-08-21 ENCOUNTER — Encounter: Payer: 59 | Admitting: Obstetrics & Gynecology

## 2017-08-23 ENCOUNTER — Other Ambulatory Visit: Payer: 59

## 2017-08-23 DIAGNOSIS — Z3493 Encounter for supervision of normal pregnancy, unspecified, third trimester: Secondary | ICD-10-CM

## 2017-08-23 DIAGNOSIS — Z3482 Encounter for supervision of other normal pregnancy, second trimester: Secondary | ICD-10-CM | POA: Diagnosis not present

## 2017-08-23 LAB — POCT CBG (FASTING - GLUCOSE)-MANUAL ENTRY: Glucose Fasting, POC: 86 mg/dL (ref 70–99)

## 2017-08-24 LAB — GESTATIONAL GLUCOSE TOLERANCE
GLUCOSE 1 HOUR GTT: 149 mg/dL (ref 65–179)
GLUCOSE FASTING: 71 mg/dL (ref 65–94)
Glucose, GTT - 2 Hour: 114 mg/dL (ref 65–154)
Glucose, GTT - 3 Hour: 81 mg/dL (ref 65–139)

## 2017-08-29 ENCOUNTER — Other Ambulatory Visit: Payer: Self-pay

## 2017-08-29 MED ORDER — CHOLECALCIFEROL 25 MCG (1000 UT) PO TABS: 1000.0000 [IU] | ORAL_TABLET | Freq: Every day | ORAL | 1 refills | Status: AC

## 2017-09-26 ENCOUNTER — Ambulatory Visit (INDEPENDENT_AMBULATORY_CARE_PROVIDER_SITE_OTHER): Payer: 59 | Admitting: Family Medicine

## 2017-09-26 ENCOUNTER — Encounter: Payer: Self-pay | Admitting: Family Medicine

## 2017-09-26 ENCOUNTER — Other Ambulatory Visit: Payer: Self-pay

## 2017-09-26 VITALS — BP 98/61 | HR 76 | Temp 98.3°F | Wt 207.0 lb

## 2017-09-26 DIAGNOSIS — O34219 Maternal care for unspecified type scar from previous cesarean delivery: Secondary | ICD-10-CM

## 2017-09-26 DIAGNOSIS — Z3480 Encounter for supervision of other normal pregnancy, unspecified trimester: Secondary | ICD-10-CM

## 2017-09-26 DIAGNOSIS — O09523 Supervision of elderly multigravida, third trimester: Secondary | ICD-10-CM

## 2017-09-26 NOTE — Patient Instructions (Addendum)
 Lactancia materna Breastfeeding Decidir amamantar es una de las mejores elecciones que puede hacer por usted y su beb. Un cambio en las hormonas durante el embarazo hace que las mamas produzcan leche materna en las glndulas productoras de leche. Las hormonas impiden que la leche materna sea liberada antes del nacimiento del beb. Adems, impulsan el flujo de leche luego del nacimiento. Una vez que ha comenzado a amamantar, pensar en el beb, as como la succin o el llanto, pueden estimular la liberacin de leche de las glndulas productoras de leche. Los beneficios de amamantar Las investigaciones demuestran que la lactancia materna ofrece muchos beneficios de salud para bebs y madres. Adems, ofrece una forma gratuita y conveniente de alimentar al beb. Para el beb  La primera leche (calostro) ayuda a mejorar el funcionamiento del aparato digestivo del beb.  Las clulas especiales de la leche (anticuerpos) ayudan a combatir las infecciones en el beb.  Los bebs que se alimentan con leche materna tambin tienen menos probabilidades de tener asma, alergias, obesidad o diabetes de tipo 2. Adems, tienen menor riesgo de sufrir el sndrome de muerte sbita del lactante (SMSL).  Los nutrientes de la leche materna son mejores para satisfacer las necesidades del beb en comparacin con la leche maternizada.  La leche materna mejora el desarrollo cerebral del beb. Para usted  La lactancia materna favorece el desarrollo de un vnculo muy especial entre la madre y el beb.  Es conveniente. La leche materna es econmica y siempre est disponible a la temperatura correcta.  La lactancia materna ayuda a quemar caloras. Le ayuda a perder el peso ganado durante el embarazo.  Hace que el tero vuelva al tamao que tena antes del embarazo ms rpido. Adems, disminuye el sangrado (loquios) despus del parto.  La lactancia materna contribuye a reducir el riesgo de tener diabetes de tipo 2,  osteoporosis, artritis reumatoide, enfermedades cardiovasculares y cncer de mama, ovario, tero y endometrio en el futuro. Informacin bsica sobre la lactancia Comienzo de la lactancia  Encuentre un lugar cmodo para sentarse o acostarse, con un buen respaldo para el cuello y la espalda.  Coloque una almohada o una manta enrollada debajo del beb para acomodarlo a la altura de la mama (si est sentada). Las almohadas para amamantar se han diseado especialmente a fin de servir de apoyo para los brazos y el beb mientras amamanta.  Asegrese de que la barriga del beb (abdomen) est frente a la suya.  Masajee suavemente la mama. Con las yemas de los dedos, masajee los bordes exteriores de la mama hacia adentro, en direccin al pezn. Esto estimula el flujo de leche. Si la leche fluye lentamente, es posible que deba continuar con este movimiento durante la lactancia.  Sostenga la mama con 4 dedos por debajo y el pulgar por arriba del pezn (forme la letra "C" con la mano). Asegrese de que los dedos se encuentren lejos del pezn y de la boca del beb.  Empuje suavemente los labios del beb con el pezn o con el dedo.  Cuando la boca del beb se abra lo suficiente, acrquelo rpidamente a la mama e introduzca todo el pezn y la arola, tanto como sea posible, dentro de la boca del beb. La arola es la zona de color que rodea al pezn. ? Debe haber ms arola visible por arriba del labio superior del beb que por debajo del labio inferior. ? Los labios del beb deben estar abiertos y extendidos hacia afuera (evertidos) para asegurar que   el beb se prenda de forma adecuada y cmoda. ? La lengua del beb debe estar entre la enca inferior y la mama.  Asegrese de que la boca del beb est en la posicin correcta alrededor del pezn (prendido). Los labios del beb deben crear un sello sobre la mama y estar doblados hacia afuera (invertidos).  Es comn que el beb succione durante 2 a 3 minutos  para que comience el flujo de leche materna. Cmo debe prenderse Es muy importante que le ensee al beb cmo prenderse adecuadamente a la mama. Si el beb no se prende adecuadamente, puede causar dolor en los pezones, reducir la produccin de leche materna y hacer que el beb tenga un escaso aumento de peso. Adems, si el beb no se prende adecuadamente al pezn, puede tragar aire durante la alimentacin. Esto puede causarle molestias al beb. Hacer eructar al beb al cambiar de mama puede ayudarlo a liberar el aire. Sin embargo, ensearle al beb cmo prenderse a la mama adecuadamente es la mejor manera de evitar que se sienta molesto por tragar aire mientras se alimenta. Signos de que el beb se ha prendido adecuadamente al pezn  Tironea o succiona de modo silencioso, sin causarle dolor. Los labios del beb deben estar extendidos hacia afuera (evertidos).  Se escucha que traga cada 3 o 4 succiones una vez que la leche ha comenzado a fluir (despus de que se produzca el reflejo de eyeccin de la leche).  Hay movimientos musculares por arriba y por delante de sus odos al succionar.  Signos de que el beb no se ha prendido adecuadamente al pezn  Hace ruidos de succin o de chasquido mientras se alimenta.  Siente dolor en los pezones.  Si cree que el beb no se prendi correctamente, deslice el dedo en la comisura de la boca y colquelo entre las encas del beb para interrumpir la succin. Intente volver a comenzar a amamantar. Signos de lactancia materna exitosa Signos del beb  El beb disminuir gradualmente el nmero de succiones o dejar de succionar por completo.  El beb se quedar dormido.  El cuerpo del beb se relajar.  El beb retendr una pequea cantidad de leche en la boca.  El beb se desprender solo del pecho.  Signos que presenta usted  Las mamas han aumentado la firmeza, el peso y el tamao 1 a 3 horas despus de amamantar.  Estn ms blandas inmediatamente  despus de amamantar.  Se producen un aumento del volumen de leche y un cambio en su consistencia y color hacia el quinto da de lactancia.  Los pezones no duelen, no estn agrietados ni sangran.  Signos de que su beb recibe la cantidad de leche suficiente  Mojar por lo menos 1 o 2paales durante las primeras 24horas despus del nacimiento.  Mojar por lo menos 5 o 6paales cada 24horas durante la primera semana despus del nacimiento. La orina debe ser clara o de color amarillo plido a los 5das de vida.  Mojar entre 6 y 8paales cada 24horas a medida que el beb sigue creciendo y desarrollndose.  Defeca por lo menos 3 veces en 24 horas a los 5 das de vida. Las heces deben ser blandas y amarillentas.  Defeca por lo menos 3 veces en 24 horas a los 7 das de vida. Las heces deben ser grumosas y amarillentas.  No registra una prdida de peso mayor al 10% del peso al nacer durante los primeros 3 das de vida.  Aumenta de peso un   promedio de 4 a 7onzas (113 a 198g) por semana despus de los 4 das de vida.  Aumenta de peso, diariamente, de manera uniforme a partir de los 5 das de vida, sin registrar prdida de peso despus de las 2semanas de vida. Despus de alimentarse, es posible que el beb regurgite una pequea cantidad de leche. Esto es normal. Frecuencia y duracin de la lactancia El amamantamiento frecuente la ayudar a producir ms leche y puede prevenir dolores en los pezones y las mamas extremadamente llenas (congestin mamaria). Alimente al beb cuando muestre signos de hambre o si siente la necesidad de reducir la congestin de las mamas. Esto se denomina "lactancia a demanda". Las seales de que el beb tiene hambre incluyen las siguientes:  Aumento del estado de alerta, actividad o inquietud.  Mueve la cabeza de un lado a otro.  Abre la boca cuando se le toca la mejilla o la comisura de la boca (reflejo de bsqueda).  Aumenta las vocalizaciones, tales como  sonidos de succin, se relame los labios, emite arrullos, suspiros o chirridos.  Mueve la mano hacia la boca y se chupa los dedos o las manos.  Est molesto o llora.  Evite el uso del chupete en las primeras 4 a 6 semanas despus del nacimiento del beb. Despus de este perodo, podr usar un chupete. Las investigaciones demostraron que el uso del chupete durante el primer ao de vida del beb disminuye el riesgo de tener el sndrome de muerte sbita del lactante (SMSL). Permita que el nio se alimente en cada mama todo lo que desee. Cuando el beb se desprende o se queda dormido mientras se est alimentando de la primera mama, ofrzcale la segunda. Debido a que, con frecuencia, los recin nacidos estn somnolientos las primeras semanas de vida, es posible que deba despertar al beb para alimentarlo. Los horarios de lactancia varan de un beb a otro. Sin embargo, las siguientes reglas pueden servir como gua para ayudarla a garantizar que el beb se alimenta adecuadamente:  Se puede amamantar a los recin nacidos (bebs de 4 semanas o menos de vida) cada 1 a 3 horas.  No deben transcurrir ms de 3 horas durante el da o 5 horas durante la noche sin que se amamante a los recin nacidos.  Debe amamantar al beb un mnimo de 8 veces en un perodo de 24 horas.  Extraccin de leche materna La extraccin y el almacenamiento de la leche materna le permiten asegurarse de que el beb se alimente exclusivamente de su leche materna, aun en momentos en los que no puede amamantar. Esto tiene especial importancia si debe regresar al trabajo en el perodo en que an est amamantando o si no puede estar presente en los momentos en que el beb debe alimentarse. Su asesor en lactancia puede ayudarla a encontrar un mtodo de extraccin que funcione mejor para usted y orientarla sobre cunto tiempo es seguro almacenar leche materna. Cmo cuidar las mamas durante la lactancia Los pezones pueden secarse, agrietarse y  doler durante la lactancia. Las siguientes recomendaciones pueden ayudarla a mantener las mamas humectadas y sanas:  Evite usar jabn en los pezones.  Use un sostn de soporte diseado especialmente para la lactancia materna. Evite usar sostenes con aro o sostenes muy ajustados (sostenes deportivos).  Seque al aire sus pezones durante 3 a 4minutos despus de amamantar al beb.  Utilice solo apsitos de algodn en el sostn para absorber las prdidas de leche. La prdida de un poco de leche materna   entre las tomas es normal.  Utilice lanolina sobre los pezones luego de amamantar. La lanolina ayuda a mantener la humedad normal de la piel. La lanolina pura no es perjudicial (no es txica) para el beb. Adems, puede extraer manualmente algunas gotas de leche materna y masajear suavemente esa leche sobre los pezones para que la leche se seque al aire.  Durante las primeras semanas despus del nacimiento, algunas mujeres experimentan congestin mamaria. La congestin mamaria puede hacer que sienta las mamas pesadas, calientes y sensibles al tacto. El pico de la congestin mamaria ocurre en el plazo de los 3 a 5 das despus del parto. Las siguientes recomendaciones pueden ayudarla a aliviar la congestin mamaria:  Vace por completo las mamas al amamantar o extraer leche. Puede aplicar calor hmedo en las mamas (en la ducha o con toallas hmedas para manos) antes de amamantar o extraer leche. Esto aumenta la circulacin y ayuda a que la leche fluya. Si el beb no vaca por completo las mamas cuando lo amamanta, extraiga la leche restante despus de que haya finalizado.  Aplique compresas de hielo sobre las mamas inmediatamente despus de amamantar o extraer leche, a menos que le resulte demasiado incmodo. Haga lo siguiente: ? Ponga el hielo en una bolsa plstica. ? Coloque una toalla entre la piel y la bolsa de hielo. ? Coloque el hielo durante 20minutos, 2 o 3veces por da.  Asegrese de que el  beb est prendido y se encuentre en la posicin correcta mientras lo alimenta.  Si la congestin mamaria persiste luego de 48 horas o despus de seguir estas recomendaciones, comunquese con su mdico o un asesor en lactancia. Recomendaciones de salud general durante la lactancia  Consuma 3 comidas y 3 colaciones saludables todos los das. Las madres bien alimentadas que amamantan necesitan entre 450 y 500 caloras adicionales por da. Puede cumplir con este requisito al aumentar la cantidad de una dieta equilibrada que realice.  Beba suficiente agua para mantener la orina clara o de color amarillo plido.  Descanse con frecuencia, reljese y siga tomando sus vitaminas prenatales para prevenir la fatiga, el estrs y los niveles bajos de vitaminas y minerales en el cuerpo (deficiencias de nutrientes).  No consuma ningn producto que contenga nicotina o tabaco, como cigarrillos y cigarrillos electrnicos. El beb puede verse afectado por las sustancias qumicas de los cigarrillos que pasan a la leche materna y por la exposicin al humo ambiental del tabaco. Si necesita ayuda para dejar de fumar, consulte al mdico.  Evite el consumo de alcohol.  No consuma drogas ilegales o marihuana.  Antes de usar cualquier medicamento, hable con el mdico. Estos incluyen medicamentos recetados y de venta libre, como tambin vitaminas y suplementos a base de hierbas. Algunos medicamentos, que pueden ser perjudiciales para el beb, pueden pasar a travs de la leche materna.  Puede quedar embarazada durante la lactancia. Si se desea un mtodo anticonceptivo, consulte al mdico sobre cules son las opciones seguras durante la lactancia. Dnde encontrar ms informacin: Liga internacional [place]eche: [email or web address]. Comunquese con un mdico si:  Siente que quiere dejar de amamantar o se siente frustrada con la lactancia.  Sus pezones estn agrietados o sangran.  Sus mamas estn irritadas, sensibles o  calientes.  Tiene los siguientes sntomas: ? Dolor en las mamas o en los pezones. ? Un rea hinchada en cualquiera de las mamas. ? Fiebre o escalofros. ? Nuseas o vmitos. ? Drenaje de otro lquido distinto de la leche materna desde los pezones.    Sus mamas no se llenan antes de Museum/gallery exhibitions officer al beb para el quinto da despus del [place].  Se siente triste y deprimida.  El beb: ? Est demasiado somnoliento como para comer bien. ? Tiene problemas para dormir. ? Tiene ms de 1 semana de vida y [employer] de 6 paales en un periodo de 24 horas. ? No ha aumentado de [place] a los [address] de [address]  El beb defeca menos de 3 veces en 24 horas.  La piel del beb o las partes blancas de los ojos se vuelven amarillentas. Solicite ayuda de inmediato si:  El beb est muy cansado Retail buyer) y no se quiere despertar para comer.  Le sube la fiebre sin causa. Resumen  La lactancia materna ofrece muchos beneficios de salud para bebs y [place].  Intente amamantar a su beb cuando muestre signos tempranos de hambre.  Haga cosquillas o empuje suavemente los labios del beb con el dedo o el pezn para lograr que el beb abra la boca. Acerque el beb a la mama. Asegrese de que la mayor parte de la arola se encuentre dentro de la boca del beb. Ofrzcale una mama y haga eructar al beb antes de pasar a la otra.  Hable con su mdico o asesor en lactancia si tiene dudas o problemas con la lactancia. Esta informacin no tiene Theme park manager el consejo del mdico. Asegrese de hacerle al mdico cualquier pregunta que tenga. Document Released: 02/19/2005 Document Revised: 06/11/2016 Document Reviewed: 06/11/2016 Elsevier Interactive Patient Education  2018 [employer]. Parto vaginal despus de Eustace Quail (Vaginal Birth After Cesarean Delivery) Un parto vaginal despus de un parto por cesrea es dar a luz por la vagina despus de haber dado a luz por medio de una intervencin [place]. En el  pasado, si una mujer tena un beb por cesrea, todos los partos posteriores deban hacerse por cesrea. Esto ya no es as. Puede ser seguro para la mam intentar un parto vaginal luego de una cesrea. Es importante que converse con su mdico desde comienzos del Psychiatrist de modo que pueda [employer], beneficios y opciones. Le dar tiempo para decidir qu es lo mejor en su caso particular. La decisin final de tener un parto vaginal o por cesrea debe tomarse en conjunto, entre usted y el mdico. Cualquier cambio en su salud o la de su beb durante el embarazo puede ser motivo de un cambio de decisin respecto del parto vaginal. LAS MUJERES QUE OPTAN POR EL PARTO VAGINAL, DEBEN CONSULTAR AL MDICO PARA ASEGURARSE DE QUE:  La cesrea anterior se haya realizado con un corte (incisin) uterino transversal (no con una incisin vertical clsica).  El canal de parto es lo suficientemente grande como para que pase el [place].  No ha sido sometida a otras operaciones del tero.  Durante el trabajo de parto, le realizarn un monitoreo fetal Forensic scientist, en todo momento.  Habr un quirfano disponible y listo en caso de necesitar una cesrea de emergencia.  Un mdico y personal de quirfano estarn disponibles en todo momento durante el [place] de parto, para realizar una cesrea en caso de ser necesario.  Habr un anestesista disponible en caso de necesitar una cesrea de emergencia.  La nursery est lista y cuenta con personal especializado y el equipo disponible para cuidar al beb en caso de emergencia. BENEFICIOS DEL PARTO VAGINAL:  Permanencia ms breve en el hospital.  Prevencin de los riesgos asociados con el parto por cesrea, por ejemplo: ? Complicaciones quirrgicas, como  apertura o hernia de la incisin. ? Lesiones en otros rganos. ? Fiebre. Esto puede ocurrir si aparece una infeccin despus de la ciruga. Tambin puede ocurrir como reaccin a los medicamentos administrados  para adormecerla durante la [place].  Menos prdida de sangre y menos probabilidad de necesitar una transfusin sangunea.  Menor riesgo de cogulos sanguneos e infeccin.  Tiempo ms corto de recuperacin.  Menor riesgo de remocin del tero (histerectoma).  Menor riesgo de que la placenta cubra parcial o completamente la abertura del tero (placenta previa) en embarazos futuros.  Menos riesgos en el Frank[place]ajo de parto y [place] parto futuros. RIESGOS  Ruptura del tero. Esto ocurre en menos del 1% de los partos vaginales. El riesgo de que eso suceda es mayor si: ? Se toman medidas para iniciar el proceso del trabajo de parto (inducir Engineer, manufacturing systemsel parto) o Risk managerestimular o intensificar las contracciones (aumentar el trabajo de [place]). ? Se usan medicamentos para ablandar (madurar) el cuello del tero.  Es necesario extraer el tero (histerectoma) si se rompe. No debe llevarse a cabo si:  La cesrea previa se realiz con una incisin vertical (clsica) o con forma de T, o usted no sabe cul de Lucent Technologiesellas le han practicado.  Ha sufrido ruptura del tero.  Ha tenido ciertos tipos de Leisure centre managerciruga en el tero, como la extirpacin de fibromas uterinos. Pregntele a su mdico sobre otros tipos de cirugas que le impiden tener un parto vaginal.  Tiene ciertos problemas mdicos o relacionados con el parto (obsttricos).  El beb est en problemas.  Tuvo dos cesreas previas y ningn parto vaginal. OTRAS COSAS QUE DEBE SABER:  La anestesia peridural es segura.  Es seguro dar vuelta al beb si se encuentra de nalgas (intentar una versin ceflica externa).  Es seguro intentarlo en caso de mellizos.  El parto vaginal puede no ser apropiado si el beb pesa 8,8lb (4kg) o ms. Sin embargo, las predicciones de [place] no son siempre exactas y no deben ser lo nico a tenerse en cuenta para decidir si el parto vaginal es lo indicado para usted.  Hay aumento en el porcentaje de fracasos si el intervalo entre la cesrea  y el parto vaginal es de menos de 19 meses.  Su mdico puede aconsejarle no tener un parto vaginal si tiene preeclampsia (hipertensin, protena en la orina e hinchazn en la cara y las extremidades).  El parto vaginal suele ser exitoso si ya tuvo un parto vaginal previamente.  Tambin suele ser exitoso cuando el trabajo de parto comienza espontneamente antes de la fecha.  El parto vaginal despus de Eustace Quailuna cesrea es similar a un parto espontneo vaginal normal. Esta informacin no tiene Theme park managercomo fin reemplazar el consejo del mdico. Asegrese de hacerle al mdico cualquier pregunta que tenga. Document Released: 08/08/2007 Document Revised: 12/10/2012 Document Reviewed: 09/18/2012 Elsevier Interactive Patient Education  [employer]2018 Elsevier Inc.

## 2017-09-26 NOTE — Progress Notes (Signed)
PRENATAL VISIT NOTE  Subjective:  Amber Fuentes is a 37 y.o. G3P2002 at [redacted]w[redacted]d being seen today for ongoing prenatal care.  She is currently monitored for the following issues for this low-risk pregnancy and has Healthcare maintenance; Supervision of other normal pregnancy, antepartum; History of cesarean section complicating pregnancy; Advanced maternal age in multigravida; and Vitamin D deficiency on their problem list.  Patient reports no complaints.  Contractions: Not present. Vag. Bleeding: None.  Movement: Present. Denies leaking of fluid.   The following portions of the patient's history were reviewed and updated as appropriate: allergies, current medications, past family history, past medical history, past social history, past surgical history and problem list. Problem list updated.  Objective:   Vitals:   09/26/17 1339  BP: 98/61  Pulse: 76  Temp: 98.3 F (36.8 C)  TempSrc: Oral  SpO2: 98%  Weight: 207 lb (93.9 kg)    Fetal Status: Fetal Heart Rate (bpm): 135 Fundal Height: 32 cm Movement: Present     General:  Alert, oriented and cooperative. Patient is in no acute distress.  Skin: Skin is warm and dry. No rash noted.   Cardiovascular: Normal heart rate noted  Respiratory: Normal respiratory effort, no problems with respiration noted  Abdomen: Soft, gravid, appropriate for gestational age.        Pelvic: Cervical exam deferred        Extremities: Normal range of motion.  Edema: Trace  Mental Status: Normal mood and affect. Normal behavior. Normal judgment and thought content.   Assessment and Plan:  Pregnancy: G3P2002 at [redacted]w[redacted]d  1. Multigravida of advanced maternal age in third trimester nml quad Declined further u/s and age is not > 40 so ok. Watch FH, if discrepant, reconsider u/s  2. History of cesarean section complicating pregnancy Risk of uterine rupture at term is 0.78 percent with TOLAC and 0.22 percent with ERCD. 1 in 10 uterine ruptures will result  in neonatal death or neurological injury. The benefits of a trial of labor after cesarean (TOLAC) resulting in a vaginal birth after cesarean (VBAC) include the following: shorter length of hospital stay and postpartum recovery (in most cases); fewer complications, such as postpartum fever, wound or uterine infection, thromboembolism (blood clots in the leg or lung), need for blood transfusion and fewer neonatal breathing problems. The risks of an attempted VBAC or TOLAC include the following: . Risk of failed trial of labor after cesarean (TOLAC) without a vaginal birth after cesarean (VBAC) resulting in repeat cesarean delivery (RCD) in about 20 to 40 percent of women who attempt VBAC.  Marland Kitchen Risk of rupture of uterus resulting in an emergency cesarean delivery. The risk of uterine rupture may be related in part to the type of uterine incision made during the first cesarean delivery. A previous transverse uterine incision has the lowest risk of rupture (0.2 to 1.5 percent risk). Vertical or T-shaped uterine incisions have a higher risk of uterine rupture (4 to 9 percent risk)The risk of fetal death is very low with both VBAC and elective repeat cesarean delivery (ERCD), but the likelihood of fetal death is higher with VBAC than with ERCD. Maternal death is very rare with either type of delivery. The risks of an elective repeat cesarean delivery (ERCD) were reviewed with the patient including but not limited to: 04/998 risk of uterine rupture which could have serious consequences, bleeding which may require transfusion; infection which may require antibiotics; injury to bowel, bladder or other surrounding organs (bowel, bladder, ureters); injury  to the fetus; need for additional procedures including hysterectomy in the event of a life-threatening hemorrhage; thromboembolic phenomenon; abnormal placentation; incisional problems; death and other postoperative or anesthesia complications.    These risks and  benefits are summarized on the consent form, which was reviewed with the patient during the visit.  All her questions answered and she signed a consent indicating a preference for TOLAC. A copy of the consent was scanned to the chart.  3. Supervision of other normal pregnancy, antepartum Continue routine prenatal care.   Preterm labor symptoms and general obstetric precautions including but not limited to vaginal bleeding, contractions, leaking of fluid and fetal movement were reviewed in detail with the patient. Please refer to After Visit Summary for other counseling recommendations.  Return in 2 weeks (on 10/10/2017).  Future Appointments  Date Time Provider Department Center  10/21/2017  3:10 PM Oralia ManisAbraham, Sherin, DO [facility]    Reva Boresanya S Kierstan Auer, MD

## 2017-10-19 NOTE — Progress Notes (Signed)
Amber Fuentes is a 37 y.o. G3P2002 at 2846w0d here for routine follow up.  History obtained using stratus video interpretor#[id number]. She reports positive fetal movement, no leakage of fluid or blood, reports contractions every day (1-2 a day). See flow sheet for details.  A/P: Pregnancy at 2246w0d. Doing well.   Pregnancy issues include:  Vit D deficiency: taking 1000U daily + PNV, previously discussed with Dr. Macon LargeAnyanwu. Recheck of vit D showed still borderline level and recommended to continue supplementation.  H/o c-section: desires TOLAC. Has seen Dr. Shawnie PonsPratt on 7/25 AMA: normal US and quad screen. Has had referral to MFM.   Tdap given on 08/19/17 Most recent labs reviewed: 3hr GTT wnl, vitamin D still borderline (recommended to continue vit D supplement), Hgb 11.0, RPR NR, HIV NR Anatomy US reviewed: female, posterior placenta, recommended for US in third trimester d/t AMA  Infant feeding choice: breast and bottle Contraception choice: condoms Infant circumcision desired: no  GBS and gc/chlamydia testing results were obtained today.   Labor and fetal movement precautions reviewed. Follow [facility]

## 2017-10-21 ENCOUNTER — Other Ambulatory Visit (HOSPITAL_COMMUNITY)
Admission: RE | Admit: 2017-10-21 | Discharge: 2017-10-21 | Disposition: A | Discharge status: Home / Self Care | Payer: 59 | Source: Ambulatory Visit | Attending: Family Medicine | Admitting: Family Medicine

## 2017-10-21 ENCOUNTER — Ambulatory Visit (INDEPENDENT_AMBULATORY_CARE_PROVIDER_SITE_OTHER): Payer: 59 | Admitting: Family Medicine

## 2017-10-21 VITALS — BP 100/60 | HR 83 | Temp 98.3°F | Wt 216.0 lb

## 2017-10-21 DIAGNOSIS — Z3493 Encounter for supervision of normal pregnancy, unspecified, third trimester: Secondary | ICD-10-CM

## 2017-10-21 NOTE — Patient Instructions (Signed)

## 2017-10-22 LAB — CERVICOVAGINAL ANCILLARY ONLY
Chlamydia: NEGATIVE
Neisseria Gonorrhea: NEGATIVE

## 2017-10-24 ENCOUNTER — Inpatient Hospital Stay (HOSPITAL_COMMUNITY): Payer: 59 | Admitting: Anesthesiology

## 2017-10-24 ENCOUNTER — Inpatient Hospital Stay (HOSPITAL_COMMUNITY)
Admission: AD | Admit: 2017-10-24 | Discharge: 2017-10-26 | DRG: 807 | Disposition: A | Discharge status: Home / Self Care | Payer: 59 | Source: Ambulatory Visit | Attending: Obstetrics and Gynecology | Admitting: Obstetrics and Gynecology

## 2017-10-24 ENCOUNTER — Other Ambulatory Visit: Payer: Self-pay

## 2017-10-24 ENCOUNTER — Encounter (HOSPITAL_COMMUNITY): Payer: Self-pay | Admitting: *Deleted

## 2017-10-24 DIAGNOSIS — O4292 Full-term premature rupture of membranes, unspecified as to length of time between rupture and onset of labor: Secondary | ICD-10-CM | POA: Diagnosis present

## 2017-10-24 DIAGNOSIS — E669 Obesity, unspecified: Secondary | ICD-10-CM | POA: Diagnosis present

## 2017-10-24 DIAGNOSIS — Z3490 Encounter for supervision of normal pregnancy, unspecified, unspecified trimester: Secondary | ICD-10-CM

## 2017-10-24 DIAGNOSIS — O4202 Full-term premature rupture of membranes, onset of labor within 24 hours of rupture: Secondary | ICD-10-CM | POA: Diagnosis not present

## 2017-10-24 DIAGNOSIS — O34219 Maternal care for unspecified type scar from previous cesarean delivery: Secondary | ICD-10-CM

## 2017-10-24 DIAGNOSIS — Z3480 Encounter for supervision of other normal pregnancy, unspecified trimester: Secondary | ICD-10-CM

## 2017-10-24 DIAGNOSIS — O99214 Obesity complicating childbirth: Secondary | ICD-10-CM | POA: Diagnosis present

## 2017-10-24 DIAGNOSIS — Z3A37 37 weeks gestation of pregnancy: Secondary | ICD-10-CM | POA: Diagnosis not present

## 2017-10-24 DIAGNOSIS — O09529 Supervision of elderly multigravida, unspecified trimester: Secondary | ICD-10-CM

## 2017-10-24 HISTORY — DX: Other specified health status: Z78.9

## 2017-10-24 LAB — CBC
HEMATOCRIT: 37.8 % (ref 36.0–46.0)
HEMOGLOBIN: 12.6 g/dL (ref 12.0–15.0)
MCH: 27.8 pg (ref 26.0–34.0)
MCHC: 33.3 g/dL (ref 30.0–36.0)
MCV: 83.4 fL (ref 78.0–100.0)
Platelets: 259 10*3/uL (ref 150–400)
RBC: 4.53 MIL/uL (ref 3.87–5.11)
RDW: 15.7 % — ABNORMAL HIGH (ref 11.5–15.5)
WBC: 12.8 10*3/uL — AB (ref 4.0–10.5)

## 2017-10-24 LAB — GROUP B STREP BY PCR: Group B strep by PCR: NEGATIVE

## 2017-10-24 LAB — TYPE AND SCREEN
ABO/RH(D): O POS
Antibody Screen: NEGATIVE

## 2017-10-24 LAB — ABO/RH: ABO/RH(D): O POS

## 2017-10-24 LAB — POCT FERN TEST: POCT Fern Test: POSITIVE

## 2017-10-24 LAB — OB RESULTS CONSOLE GBS: STREP GROUP B AG: NEGATIVE

## 2017-10-24 MED ORDER — ONDANSETRON HCL 4 MG/2ML IJ SOLN: 4.0000 mg | Freq: Four times a day (QID) | INTRAMUSCULAR | Status: DC | PRN

## 2017-10-24 MED ORDER — LACTATED RINGERS IV SOLN
INTRAVENOUS | Status: DC
  Administered 2017-10-24: 13:00:00 via INTRAVENOUS

## 2017-10-24 MED ORDER — TERBUTALINE SULFATE 1 MG/ML IJ SOLN
0.2500 mg | Freq: Once | INTRAMUSCULAR | Status: DC | PRN
  Filled 2017-10-24: qty 1

## 2017-10-24 MED ORDER — LIDOCAINE HCL (PF) 1 % IJ SOLN
30.0000 mL | INTRAMUSCULAR | Status: DC | PRN
  Filled 2017-10-24: qty 30

## 2017-10-24 MED ORDER — PHENYLEPHRINE 40 MCG/ML (10ML) SYRINGE FOR IV PUSH (FOR BLOOD PRESSURE SUPPORT)
80.0000 ug | PREFILLED_SYRINGE | INTRAVENOUS | Status: DC | PRN
  Filled 2017-10-24: qty 10
  Filled 2017-10-24: qty 5

## 2017-10-24 MED ORDER — SOD CITRATE-CITRIC ACID 500-334 MG/5ML PO SOLN: 30.0000 mL | ORAL | Status: DC | PRN

## 2017-10-24 MED ORDER — OXYTOCIN 40 UNITS IN LACTATED RINGERS INFUSION - SIMPLE MED
2.5000 [IU]/h | INTRAVENOUS | Status: DC
  Administered 2017-10-24: 2.5 [IU]/h via INTRAVENOUS
  Filled 2017-10-24: qty 1000

## 2017-10-24 MED ORDER — LACTATED RINGERS IV SOLN: 500.0000 mL | Freq: Once | INTRAVENOUS | Status: DC

## 2017-10-24 MED ORDER — EPHEDRINE 5 MG/ML INJ
10.0000 mg | INTRAVENOUS | Status: DC | PRN
  Filled 2017-10-24: qty 2

## 2017-10-24 MED ORDER — OXYCODONE-ACETAMINOPHEN 5-325 MG PO TABS: 2.0000 | ORAL_TABLET | ORAL | Status: DC | PRN

## 2017-10-24 MED ORDER — OXYCODONE-ACETAMINOPHEN 5-325 MG PO TABS: 1.0000 | ORAL_TABLET | ORAL | Status: DC | PRN

## 2017-10-24 MED ORDER — OXYTOCIN 40 UNITS IN LACTATED RINGERS INFUSION - SIMPLE MED
1.0000 m[IU]/min | INTRAVENOUS | Status: DC
  Administered 2017-10-24: 2 m[IU]/min via INTRAVENOUS

## 2017-10-24 MED ORDER — LACTATED RINGERS IV SOLN
500.0000 mL | INTRAVENOUS | Status: DC | PRN
  Administered 2017-10-24: 500 mL via INTRAVENOUS

## 2017-10-24 MED ORDER — PHENYLEPHRINE 40 MCG/ML (10ML) SYRINGE FOR IV PUSH (FOR BLOOD PRESSURE SUPPORT)
80.0000 ug | PREFILLED_SYRINGE | INTRAVENOUS | Status: DC | PRN
  Filled 2017-10-24: qty 5

## 2017-10-24 MED ORDER — OXYTOCIN BOLUS FROM INFUSION
500.0000 mL | Freq: Once | INTRAVENOUS | Status: AC
  Administered 2017-10-24: 500 mL via INTRAVENOUS

## 2017-10-24 MED ORDER — LIDOCAINE HCL (PF) 1 % IJ SOLN
INTRAMUSCULAR | Status: DC | PRN
  Administered 2017-10-24 (×2): 4 mL via EPIDURAL

## 2017-10-24 MED ORDER — DIPHENHYDRAMINE HCL 50 MG/ML IJ SOLN: 12.5000 mg | INTRAMUSCULAR | Status: DC | PRN

## 2017-10-24 MED ORDER — ACETAMINOPHEN 325 MG PO TABS: 650.0000 mg | ORAL_TABLET | ORAL | Status: DC | PRN

## 2017-10-24 MED ORDER — FENTANYL 2.5 MCG/ML BUPIVACAINE 1/10 % EPIDURAL INFUSION (WH - ANES)
14.0000 mL/h | INTRAMUSCULAR | Status: DC | PRN
  Administered 2017-10-24: 14 mL/h via EPIDURAL
  Filled 2017-10-24: qty 100

## 2017-10-24 NOTE — Progress Notes (Signed)
Pt informed that the ultrasound is considered a limited OB ultrasound and is not intended to be a complete ultrasound exam.  Patient also informed that the ultrasound is not being completed with the intent of assessing for fetal or placental anomalies or any pelvic abnormalities.  Explained that the purpose of today's ultrasound is to assess for  presentation.  Patient acknowledges the purpose of the exam and the limitations of the study.  Vertex   Amber Smyser, NP    

## 2017-10-24 NOTE — Anesthesia Preprocedure Evaluation (Signed)
Anesthesia Evaluation  Patient identified by MRN, date of birth, ID band Patient awake    Reviewed: Allergy & Precautions, Patient's Chart, lab work & pertinent test results  Airway Mallampati: III  TM Distance: >3 FB Neck ROM: Full    Dental no notable dental hx. (+) Teeth Intact   Pulmonary neg pulmonary ROS,    Pulmonary exam normal        Cardiovascular negative cardio ROS Normal cardiovascular exam Rhythm:Regular Rate:Normal     Neuro/Psych negative neurological ROS  negative psych ROS   GI/Hepatic Neg liver ROS, GERD  ,  Endo/Other  Obesity  Renal/GU negative Renal ROS  negative genitourinary   Musculoskeletal negative musculoskeletal ROS (+)   Abdominal (+) + obese,   Peds  Hematology negative hematology ROS (+)   Anesthesia Other Findings   Reproductive/Obstetrics (+) Pregnancy Previous C/Section                             Anesthesia Physical Anesthesia Plan  ASA: II  Anesthesia Plan: Epidural   Post-op Pain Management:    Induction:   PONV Risk Score and Plan:   Airway Management Planned: Natural Airway  Additional Equipment:   Intra-op Plan:   Post-operative Plan:   Informed Consent: I have reviewed the patients History and Physical, chart, labs and discussed the procedure including the risks, benefits and alternatives for the proposed anesthesia with the patient or authorized representative who has indicated his/her understanding and acceptance.     Plan Discussed with: Anesthesiologist  Anesthesia Plan Comments:         Anesthesia Quick Evaluation

## 2017-10-24 NOTE — H&P (Addendum)
OBSTETRIC ADMISSION HISTORY AND PHYSICAL  Amber Fuentes is a 37 y.o. female 653P2002 with IUP at 8026w5d by 1st tri US presenting for PROM (will TOLAC). She reports +FMs, No LOF, no VB, no blurry vision, headaches or peripheral edema, and RUQ pain.  She plans on breast and bottle feeding. She request condoms for birth control. [facility]   Dating: By 1st tri US --->  Estimated Date of Delivery: 11/09/17  Sono:    @19w , CWD, normal anatomy, cephalic presentation, vertex lie, 296g, 50% EFW   Prenatal History/Complications:  Past Medical History: Past Medical History:  Diagnosis Date  . Medical history non-contributory     Past Surgical History: Past Surgical History:  Procedure Laterality Date  . CESAREAN SECTION  11/22/2005    Obstetrical History: OB History    Gravida  3   Para  2   Term  2   Preterm  0   AB      Living  2     SAB      TAB      Ectopic      Multiple      Live Births  2           Social History: Social History   Socioeconomic History  . Marital status: Married    Spouse name: Not on file  . Number of children: 2  . Years of education: Not on file  . Highest education level: 6th grade  Occupational History  . Not on file  Social Needs  . Financial resource strain: Not on file  . Food insecurity:    Worry: Not on file    Inability: Not on file  . Transportation needs:    Medical: Not on file    Non-medical: Not on file  Tobacco Use  . Smoking status: Never Smoker  . Smokeless tobacco: Never Used  Substance and Sexual Activity  . Alcohol use: No    Alcohol/week: 0.0 standard drinks    Frequency: Never    Comment: OCC  . Drug use: No  . Sexual activity: Yes    Birth control/protection: None    Comment: 1st intercourse- 17, partners- 3  Lifestyle  . Physical activity:    Days per week: Not on file    Minutes per session: Not on file  . Stress: Not on file  Relationships  .  Social connections:    Talks on phone: Not on file    Gets together: Not on file    Attends religious service: Not on file    Active member of club or organization: Not on file    Attends meetings of clubs or organizations: Not on file    Relationship status: Not on file  Other Topics Concern  . Not on file  Social History Narrative   Lives with Husband 35(42 y.o.)   Owns a car for transportation   No religious or personal beliefs that affect healthcare    No advanced directive    Does not exercise regularly    Likes to play soccer for fun     Family History: Family History  Problem Relation Age of Onset  . Cancer Mother        CERVICAL  . Diabetes Sister     Allergies: No Known Allergies  Medications Prior to Admission  Medication Sig Dispense Refill Last Dose  . prenatal vitamin w/FE, FA (NATACHEW) 29-1 MG CHEW chewable tablet Chew 1 tablet  by mouth daily at 12 noon. 90 tablet 4 10/24/2017 at Unknown time  . Cholecalciferol 1000 units tablet Take 1 tablet (1,000 Units total) by mouth daily. (Patient not taking: Reported on 10/24/2017) 30 tablet 1 Not Taking at Unknown time     Review of Systems   All systems reviewed and negative except as stated in HPI  Blood pressure 114/75, pulse 76, temperature 98.4 F (36.9 C), temperature source Oral, resp. rate 18, height 5\' 4"  (1.626 m), weight 96.2 kg, SpO2 99 %. General appearance: alert, cooperative and appears stated age Lungs: clear to auscultation bilaterally Heart: regular rate and rhythm Abdomen: soft, non-tender; bowel sounds normal Pelvic: ? Extremities: Homans sign is negative, no sign of DVT Presentation: unsure Fetal monitoringBaseline: 145 bpm, moderate variability, positive accels, no decels Uterine activityFrequency: Every 10-15 minutes Dilation: 2 Effacement (%): 80 Station: -3 Exam by:: jolynn   Prenatal labs: ABO, Rh: --/--/O POS (08/22 1229) Antibody: NEG (08/22 1229) Rubella: 9.79 (02/26  1534) RPR: Non Reactive (06/17 1420)  HBsAg: Negative (02/26 1534)  HIV: Non Reactive (06/17 1420)  GBS: Negative (08/22 0000)  1 hr Glucola 138 Genetic screening  Declined Anatomy US Normal female  Prenatal Transfer Tool  Maternal Diabetes: No Genetic Screening: Normal Maternal Ultrasounds/Referrals: Normal Fetal Ultrasounds or other Referrals:  None Maternal Substance Abuse:  No Significant Maternal Medications:  Meds include: Other:  prenatal Significant Maternal Lab Results: Lab values include: Group B Strep negative  Results for orders placed or performed during the hospital encounter of 10/24/17 (from the past 24 hour(s))  OB RESULT CONSOLE Group B Strep   Collection Time: 10/24/17 12:00 AM  Result Value Ref Range   GBS Negative   Fern Test   Collection Time: 10/24/17 11:45 AM  Result Value Ref Range   POCT Fern Test Positive = ruptured amniotic membanes   Group B strep by PCR   Collection Time: 10/24/17 11:51 AM  Result Value Ref Range   Group B strep by PCR NEGATIVE NEGATIVE  CBC   Collection Time: 10/24/17 12:29 PM  Result Value Ref Range   WBC 12.8 (H) 4.0 - 10.5 K/uL   RBC 4.53 3.87 - 5.11 MIL/uL   Hemoglobin 12.6 12.0 - 15.0 g/dL   HCT 16.1 09.6 - 04.5 %   MCV 83.4 78.0 - 100.0 fL   MCH 27.8 26.0 - 34.0 pg   MCHC 33.3 30.0 - 36.0 g/dL   RDW 40.9 (H) 81.1 - 91.4 %   Platelets 259 150 - 400 K/uL  Type and screen [facility]   Collection Time: 10/24/17 12:29 PM  Result Value Ref Range   ABO/RH(D) O POS    Antibody Screen NEG    Sample Expiration      10/27/2017 Performed at [facility], [address], [place], [place] [address]     Patient Active Problem List   Diagnosis Date Noted  . Pregnancy 10/24/2017  . History of cesarean section complicating pregnancy 06/20/2017  . Advanced maternal age in multigravida 06/20/2017  . Vitamin D deficiency 06/20/2017  . Supervision of other normal pregnancy, antepartum 05/24/2017  .  Healthcare maintenance 04/30/2017    Assessment/Plan:  Amber Fuentes is a 37 y.o. G3P2002 at [redacted]w[redacted]d here for PROM. Will TOLAC.  #Labor: ? #Pain: Wants no meds but may use epidural if needed.  #FWB: Baseline FHR: 145, Moderate variability, + accels, no decels  #ID:  GBS neg #MOF: Breast and bottle #MOC: Condoms  #Circ:  Declines  Raynelle Highland, Medical Student  10/24/2017, 3:50 PM  I confirm that I have verified the information documented in the medical student's's note and that I have also personally performed the physical exam and all medical decision making activities.   Clayton Bibles, CNM 10/24/17  5:03 PM

## 2017-10-24 NOTE — Anesthesia Pain Management Evaluation Note (Signed)
  [facility] Pain Management Visit Note  Patient: Amber Fuentes, 37 y.o., female  "Hello I am a member of the anesthesia team at [facility]. We have an anesthesia team available at all times to provide care throughout the hospital, including epidural management and anesthesia for C-section. I don't know your plan for the delivery whether it a natural birth, water birth, IV sedation, nitrous supplementation, doula or epidural, but we want to meet your pain goals."   1.Was your pain managed to your expectations on prior hospitalizations?   Yes   2.What is your expectation for pain management during this hospitalization?     Epidural and IV pain meds  3.How can we help you reach that goal? Be available  Record the patient's initial score and the patient's pain goal.   Pain: 6  Pain Goal: 6 [facility] wants you to be able to say your pain was always managed very well.  [facility] CenterMERRITT,Zadin Lange 10/24/2017

## 2017-10-24 NOTE — Anesthesia Procedure Notes (Signed)
Epidural Patient location during procedure: OB Start time: 10/24/2017 7:26 PM End time: 10/24/2017 7:35 PM  Staffing Anesthesiologist: Mal AmabileFoster, Wade Sigala, MD  Preanesthetic Checklist Completed: patient identified, site marked, surgical consent, pre-op evaluation, timeout performed, IV checked, risks and benefits discussed and monitors and equipment checked  Epidural Patient position: sitting Prep: site prepped and draped and DuraPrep Patient monitoring: continuous pulse ox and blood pressure Approach: midline Location: L3-L4 Injection technique: LOR air  Needle:  Needle type: Tuohy  Needle gauge: 17 G Needle length: 9 cm and 9 Needle insertion depth: 5 cm cm Catheter type: closed end flexible Catheter size: 19 Gauge Catheter at skin depth: 10 cm Test dose: negative and Other  Assessment Events: blood not aspirated, injection not painful, no injection resistance, negative IV test and no paresthesia  Additional Notes Patient identified. Risks and benefits discussed including failed block, incomplete  Pain control, post dural puncture headache, nerve damage, paralysis, blood pressure Changes, nausea, vomiting, reactions to medications-both toxic and allergic and post Partum back pain. All questions were answered. Patient expressed understanding and wished to proceed. Sterile technique was used throughout procedure. Epidural site was Dressed with sterile barrier dressing. No paresthesias, signs of intravascular injection Or signs of intrathecal spread were encountered.  Patient was more comfortable after the epidural was dosed. Please see RN's note for documentation of vital signs and FHR which are stable.

## 2017-10-24 NOTE — Progress Notes (Signed)
Amber Fuentes is a 37 y.o. G3P2002 at 2123w5d  Subjective: Considering epidural  Objective: BP 101/64   Pulse 70   Temp 98 F (36.7 C) (Oral)   Resp 16   Ht 5\' 4"  (1.626 m)   Wt 96.2 kg   LMP  (LMP Unknown)   SpO2 99%   BMI 36.39 kg/m  No intake/output data recorded. No intake/output data recorded.  FHT:  FHR: 140 bpm, variability: moderate,  accelerations:  Present,  decelerations:  Present rare variable and early decels UC:   irregular, every 2-7 minutes SVE:   Dilation: 5.5 Effacement (%): 80 Station: Plus 1 Exam by:: weinhold  Labs: Lab Results  Component Value Date   WBC 12.8 (H) 10/24/2017   HGB 12.6 10/24/2017   HCT 37.8 10/24/2017   MCV 83.4 10/24/2017   PLT 259 10/24/2017    Assessment / Plan: SROM., progressing well on Pitocin augmentation  Labor: Progressing normally Preeclampsia:  N/A Fetal Wellbeing:  Category I Pain Control:  Labor support without medications, considering epidural, order placed I/D:  n/a Anticipated MOD:  NSVD  Calvert CantorSamantha C Weinhold, CNM 10/24/2017, 6:50 PM

## 2017-10-24 NOTE — MAU Note (Signed)
Thinks her water may have broke, around 2000, clear fluids continue.  Small amt blood this morning.  Contractions started at 0200, every 8-9 min now.

## 2017-10-25 ENCOUNTER — Encounter (HOSPITAL_COMMUNITY): Payer: Self-pay

## 2017-10-25 LAB — CULTURE, BETA STREP (GROUP B ONLY): Strep Gp B Culture: NEGATIVE

## 2017-10-25 LAB — RPR: RPR Ser Ql: NONREACTIVE

## 2017-10-25 MED ORDER — SIMETHICONE 80 MG PO CHEW: 80.0000 mg | CHEWABLE_TABLET | ORAL | Status: DC | PRN

## 2017-10-25 MED ORDER — IBUPROFEN 600 MG PO TABS
600.0000 mg | ORAL_TABLET | Freq: Four times a day (QID) | ORAL | Status: DC
  Administered 2017-10-25 – 2017-10-26 (×6): 600 mg via ORAL
  Filled 2017-10-25 (×6): qty 1

## 2017-10-25 MED ORDER — SENNOSIDES-DOCUSATE SODIUM 8.6-50 MG PO TABS
2.0000 | ORAL_TABLET | ORAL | Status: DC
  Administered 2017-10-25: 2 via ORAL
  Filled 2017-10-25: qty 2

## 2017-10-25 MED ORDER — DIBUCAINE 1 % RE OINT: 1.0000 "application " | TOPICAL_OINTMENT | RECTAL | Status: DC | PRN

## 2017-10-25 MED ORDER — PRENATAL MULTIVITAMIN CH
1.0000 | ORAL_TABLET | Freq: Every day | ORAL | Status: DC
  Administered 2017-10-25 – 2017-10-26 (×2): 1 via ORAL
  Filled 2017-10-25 (×2): qty 1

## 2017-10-25 MED ORDER — COCONUT OIL OIL: 1.0000 "application " | TOPICAL_OIL | Status: DC | PRN

## 2017-10-25 MED ORDER — ONDANSETRON HCL 4 MG PO TABS: 4.0000 mg | ORAL_TABLET | ORAL | Status: DC | PRN

## 2017-10-25 MED ORDER — BENZOCAINE-MENTHOL 20-0.5 % EX AERO
1.0000 "application " | INHALATION_SPRAY | CUTANEOUS | Status: DC | PRN
  Administered 2017-10-25: 1 via TOPICAL
  Filled 2017-10-25: qty 56

## 2017-10-25 MED ORDER — ACETAMINOPHEN 325 MG PO TABS
650.0000 mg | ORAL_TABLET | ORAL | Status: DC | PRN
  Administered 2017-10-25 – 2017-10-26 (×5): 650 mg via ORAL
  Filled 2017-10-25 (×5): qty 2

## 2017-10-25 MED ORDER — ONDANSETRON HCL 4 MG/2ML IJ SOLN: 4.0000 mg | INTRAMUSCULAR | Status: DC | PRN

## 2017-10-25 MED ORDER — TETANUS-DIPHTH-ACELL PERTUSSIS 5-2.5-18.5 LF-MCG/0.5 IM SUSP: 0.5000 mL | Freq: Once | INTRAMUSCULAR | Status: DC

## 2017-10-25 MED ORDER — ZOLPIDEM TARTRATE 5 MG PO TABS: 5.0000 mg | ORAL_TABLET | Freq: Every evening | ORAL | Status: DC | PRN

## 2017-10-25 MED ORDER — WITCH HAZEL-GLYCERIN EX PADS: 1.0000 "application " | MEDICATED_PAD | CUTANEOUS | Status: DC | PRN

## 2017-10-25 MED ORDER — DIPHENHYDRAMINE HCL 25 MG PO CAPS: 25.0000 mg | ORAL_CAPSULE | Freq: Four times a day (QID) | ORAL | Status: DC | PRN

## 2017-10-25 NOTE — Progress Notes (Signed)
Admission information/ baby safety info given to mother via interpreter [phone]. Discussed infant feedings with mother and reviewed feeding sheet. Mother's questions answered.

## 2017-10-25 NOTE — Progress Notes (Signed)
Patient wishes to order her own food. Eda H Royal Interpreter.

## 2017-10-25 NOTE — Progress Notes (Addendum)
Post Partum Day 1 Subjective: no complaints  Objective: Blood pressure (!) 98/56, pulse 75, temperature 98.4 F (36.9 C), temperature source Oral, resp. rate 18, height 5' 4" (1.626 m), weight 96.2 kg, SpO2 99 %, unknown if currently breastfeeding.  Physical Exam:  General: alert, cooperative, appears stated age and no distress Lochia: appropriate Uterine Fundus: firm Incision: n/a DVT Evaluation: No evidence of DVT seen on physical exam.  Recent Labs    10/24/17 1229  HGB 12.6  HCT 37.8    Assessment/Plan: Plan for discharge tomorrow   LOS: 1 day   Sherene Sires 10/25/2017, 9:46 AM   I have spoken with and examined this patient and agree with resident/PA-S/Med-S/SNM's note and plan of care. VSS, HRR&R, Resp unlabored, Legs neg.  Nigel Berthold, CNM 10/29/2017 2:53 PM

## 2017-10-25 NOTE — Anesthesia Postprocedure Evaluation (Signed)
Anesthesia Post Note  Patient: Amber Fuentes  Procedure(s) Performed: AN AD HOC LABOR EPIDURAL     Patient location during evaluation: Mother Baby Anesthesia Type: Epidural Level of consciousness: awake Pain management: pain level controlled Vital Signs Assessment: post-procedure vital signs reviewed and stable Respiratory status: spontaneous breathing Cardiovascular status: stable Postop Assessment: patient able to bend at knees and epidural receding Anesthetic complications: no    Last Vitals:  Vitals:   10/25/17 0109 10/25/17 0621  BP: 126/71 (!) 98/56  Pulse: (!) 105 75  Resp:  18  Temp: 36.9 C   SpO2:      Last Pain:  Vitals:   10/25/17 0621  TempSrc:   PainSc: 7    Pain Goal:                 Edison PaceWILKERSON,Reia Viernes

## 2017-10-25 NOTE — Lactation Note (Signed)
This note was copied from a baby's chart. Lactation Consultation Note  Patient Name: Amber Fuentes [id number]'s Date: 10/25/2017 Reason for consult: Initial assessment  Video interpreter used for Spanish. P3, Ex BF for 3 mos.  Mother concerned about her milk supply. Reviewed hand expression w/ drops expressed to reassure mother.  Mom encouraged to feed baby 8-12 times/24 hours and with feeding cues.  Mom made aware of O/P services, breastfeeding support groups, community resources, and our phone # for post-discharge questions.    Maternal Data    Feeding Feeding Type: Breast Fed Length of feed: (few sucks)  LATCH Score                   Interventions    Lactation Tools Discussed/Used     Consult Status Consult Status: Follow-up Date: 10/26/17 Follow-up type: In-patient    Dahlia ByesBerkelhammer, Minah Axelrod [facility] 10/25/2017, 12:40 PM

## 2017-10-26 MED ORDER — IBUPROFEN 600 MG PO TABS: 600.0000 mg | ORAL_TABLET | Freq: Four times a day (QID) | ORAL | 0 refills | Status: AC

## 2017-10-26 NOTE — Lactation Note (Signed)
This note was copied from a baby's chart. Lactation Consultation Note  Patient Name: Amber Fuentes [id number]'UToday's Date: 10/26/2017 Reason for consult: Follow-up assessment;Other (Comment);Infant weight loss(limited English/ Spanish Paula Compton- Karla [facility] [place] [phone]- #[phone]) ) - for D/C today  Baby is 5935 hours old  LC reviewed and updated the doc flow sheets per dad.  Dad , and 2 older kids at present at consult.  LC reviewed supply and demand and stressed the importance  Of giving the opportunity to work on the breast feeding.  LC recommended STS feedings until the baby can stay awake for  A feeding, if to fussy to latch, give appetizer and then latch.  Also since the baby has been supplemented may need to supplement afterwards due  To the baby not being satisfied. Sore nipple and engorgement prevention and tx reviewed.  Mom instructed on the use of a hand pump and provided a #27 F for when the milk  Comes in if the #24 F is to snug.  LC recommended prior to every feeding until the milk comes in breast massage, hand express,  Pre- pump with hand pump to prime the milk ducts.  Mother informed of post-discharge support and given phone number to the lactation department, including services for phone call assistance; out-patient appointments; and breastfeeding support group. List of other breastfeeding resources in the community given in the handout. Encouraged mother to call for problems or concerns related to breastfeeding.   Maternal Data    Feeding Feeding Type: (LC unable to assess latch - mom feeding a bottle ) Nipple Type: Slow - flow Length of feed: 5 min(per dad )  LATCH Score                   Interventions Interventions: Breast feeding basics reviewed;Hand pump  Lactation Tools Discussed/Used Tools: Pump;Flanges Flange Size: 24;27;Other (comment)(for when milk comes in ) Breast pump type: Manual Pump Review: Setup, frequency, and cleaning;Milk Storage Initiated by:: MAI   Date initiated:: 10/26/17   Consult Status Consult Status: Complete Date: 10/26/17    Matilde SprangMargaret Ann Tausha Milhoan 10/26/2017, 9:28 AM

## 2017-10-26 NOTE — Discharge Summary (Addendum)
OB Discharge Summary     Patient Name: Amber Fuentes DOB: [birth year] MRN: [id number]  Date of admission: 8/22/2Alanda Slim019 Delivering MD: Marthenia RollingBLAND, SCOTT   Date of discharge: 10/26/2017  Admitting diagnosis: 37WKS WATER BROKE Intrauterine pregnancy: 4962w5d     Secondary diagnosis:  Active Problems:   Supervision of other normal pregnancy, antepartum   History of cesarean section complicating pregnancy   Advanced maternal age in multigravida   Pregnancy  Additional problems: none     Discharge diagnosis: Term Pregnancy Delivered and VBAC                                                                                                Post partum procedures:none  Augmentation: Pitocin  Complications: None  Hospital course:  Onset of Labor With Vaginal Delivery     37 y.o. yo G3P3003 at 2362w5d was admitted in Latent Labor on 10/24/2017. She had SROM and required Pit for augmentation. Patient had an uncomplicated labor course as follows:  Membrane Rupture Time/Date: 8:00 PM ,10/23/2017   Intrapartum Procedures: Episiotomy: None [1]                                         Lacerations:  2nd degree [3]  Patient had a delivery of a Viable infant. 10/24/2017  Information for the patient's newborn:  Amber Fuentes, Amber Fuentes [[id number]]  Delivery Method: VBAC, Spontaneous(Filed from Delivery Summary)    Pateint had an uncomplicated postpartum course.  She is ambulating, tolerating a regular diet, passing flatus, and urinating well. Patient is discharged home in stable condition on 10/26/17.   Physical exam  Vitals:   10/25/17 1015 10/25/17 1419 10/25/17 2110 10/26/17 0415  BP: (!) 103/55 (!) 100/58 (!) 91/51 (!) 102/51  Pulse: 73 66 72 63  Resp: 20 18 16 18   Temp: 98.4 F (36.9 C) 98.1 F (36.7 C) 98.3 F (36.8 C) 98 F (36.7 C)  TempSrc: Oral Oral Oral Oral  SpO2:  99% 99% 100%  Weight:      Height:       General: alert, cooperative and no distress Lochia: appropriate Uterine  Fundus: firm Incision: N/A DVT Evaluation: No evidence of DVT seen on physical exam. Labs: Lab Results  Component Value Date   WBC 12.8 (H) 10/24/2017   HGB 12.6 10/24/2017   HCT 37.8 10/24/2017   MCV 83.4 10/24/2017   PLT 259 10/24/2017   CMP Latest Ref Rng & Units 04/30/2017  Glucose 65 - 99 mg/dL 84  BUN 6 - 20 mg/dL 5(L)  Creatinine 7.820.57 - 1.00 mg/dL 9.56(O0.56(L)  Sodium 130134 - 865144 mmol/L 138  Potassium 3.5 - 5.2 mmol/L 3.8  Chloride 96 - 106 mmol/L 104  CO2 20 - 29 mmol/L 19(L)  Calcium 8.7 - 10.2 mg/dL 8.8  Total Protein 6.0 - 8.5 g/dL 7.2  Total Bilirubin 0.0 - 1.2 mg/dL 0.2  Alkaline Phos 39 - 117 IU/L 107  AST 0 - 40 IU/L 21  ALT 0 - 32 IU/L 30  Discharge instruction: per After Visit Summary and "Baby and Me Booklet".  After visit meds:  Allergies as of 10/26/2017   No Known Allergies     Medication List    TAKE these medications   Cholecalciferol 1000 units tablet Take 1 tablet (1,000 Units total) by mouth daily.   ibuprofen 600 MG tablet Commonly known as:  ADVIL,MOTRIN Take 1 tablet (600 mg total) by mouth every 6 (six) hours.   prenatal vitamin w/FE, FA 29-1 MG Chew chewable tablet Chew 1 tablet by mouth daily at 12 noon.       Diet: routine diet  Activity: Advance as tolerated. Pelvic rest for 6 weeks.   Outpatient follow up:4 weeks Follow up Appt: Future Appointments  Date Time Provider Department Center  11/07/2017 11:00 AM [facility]  11/19/2017  1:30 PM Myrene Buddy, MD [facility]   Follow up Visit:No follow-ups on file.  Postpartum contraception: Condoms  Newborn Data: Live born female  Birth Weight: 7 lb (3175 g) APGAR: 9, 9  Newborn Delivery   Birth date/time:  10/24/2017 22:28:00 Delivery type:  VBAC, Spontaneous     Baby Feeding: breast/bottle Disposition:home with mother   10/26/2017 Amber Mo, MD  CNM attestation I have seen and examined this patient and agree with above documentation in  the resident's note.   Amber Fuentes is a 37 y.o. [phone] s/p VBAC.   Pain is well controlled.  Plan for birth control is condoms.  Method of Feeding: both  PE:  BP (!) 102/51 (BP Location: Right Arm)   Pulse 63   Temp 98 F (36.7 C) (Oral)   Resp 18   Ht 5\' 4"  (1.626 m)   Wt 96.2 kg   LMP  (LMP Unknown)   SpO2 100%   Breastfeeding? Unknown   BMI 36.39 kg/m  Fundus firm  No results for input(s): HGB, HCT in the last 72 hours.   Plan: discharge today - postpartum care discussed - f/u clinic in 4 weeks for postpartum visit   Amber Fuentes, CNM 2:58 PM

## 2017-10-29 ENCOUNTER — Encounter: Payer: Self-pay | Admitting: *Deleted

## 2017-11-19 ENCOUNTER — Encounter: Payer: Self-pay | Admitting: Family Medicine

## 2017-11-19 ENCOUNTER — Ambulatory Visit (INDEPENDENT_AMBULATORY_CARE_PROVIDER_SITE_OTHER): Payer: 59 | Admitting: Family Medicine

## 2017-11-19 ENCOUNTER — Other Ambulatory Visit (HOSPITAL_COMMUNITY)
Admission: RE | Admit: 2017-11-19 | Discharge: 2017-11-19 | Disposition: A | Discharge status: Home / Self Care | Payer: 59 | Source: Ambulatory Visit | Attending: Family Medicine | Admitting: Family Medicine

## 2017-11-19 NOTE — Patient Instructions (Signed)
It was great meeting you today!  Everything was good as far as her postpartum care goes.  Performed a pelvic exam there are no abnormalities seen.  The stitches that were placed is healed up nicely and resolved by now.  I also performed a Pap smear, this result will usually get mailed to you but I will call you if it is abnormal in any way.  If you have any issues pop up please let us know.  Otherwise congratulations and keep your well-child checks for your baby as scheduled.  Fue un gran encuentro contigo hoy!  Todo estaba bien en cuanto a su cuidado posparto.  Realiz un examen plvico no se han observado anomalas.  Los puntos que se colocaron se curan muy bien y se resuelven a Psychologist, counsellingestas alturas.  Tambin realic un Papanicolaou, este resultado generalmente se le enviar por correo, Chiropodistpero le llamar si es anormal de Rochesteralguna manera.  Si tiene algn problema emergente por favor hganoslo saber.  De lo contrario, felicito y Bishopmantenga sus cheques de nio sano para su beb segn lo programado.

## 2017-11-19 NOTE — Progress Notes (Signed)
   HPI 37 year old who presents for 4-week postpartum visit.  [id number] who presents for 4-week visit.  Patient has been doing very well since delivery.  She did have a grade 2 laceration that was repaired with dissolvable suture.  This area has healed up nicely.  She is still having a scant amount of spotting.  She has had no issues adjusting to this child.  She does not display any other typical symptoms of postpartum depression.  Reviewed contraceptive options.  She is opting for condoms this time.  Patient also overdue for Pap smear.  Perform Pap smear in office.  CC: postpartum visit   ROS:   Review of Systems See HPI for ROS.   CC, SH/smoking status, and VS noted  Objective: BP 110/68 (BP Location: Left Arm)   Pulse (!) 57   Temp 98.6 F (37 C) (Oral)   Wt 191 lb 12.8 oz (87 kg)   SpO2 99%   BMI 32.92 kg/m  Gen: NAD, alert, cooperative, and pleasant.  Middle-aged Latino female.  Resting comfortably no acute distress on exam table. HEENT: NCAT, EOMI, PERRL CV: RRR, no murmur Resp: CTAB, no wheezes, non-labored Abd: SNTND, BS present, no guarding or organomegaly Ext: No edema, warm Neuro: Alert and oriented, Speech clear, No gross deficits GU: Pelvic exam performed.  No gross bleeding noted for any spotting on the left.  No remarkable tenderness.  Perineal laceration was repaired nicely, there is no gross deformity.   Assessment and plan:  Routine postpartum follow-up Patient is done very well since delivery.  No signs of depression.  Perineal lacerations repaired nicely, no residual tenderness from delivery.  Perform Pap smear today   No orders of the defined types were placed in this encounter.   No orders of the defined types were placed in this encounter.    Myrene BuddyJacob Khyran Riera MD PGY-2 Family Medicine Resident  11/23/2017 9:38 AM

## 2017-11-21 LAB — CYTOLOGY - PAP
Diagnosis: NEGATIVE
HPV: NOT DETECTED

## 2017-11-23 ENCOUNTER — Encounter: Payer: Self-pay | Admitting: Family Medicine

## 2017-11-23 NOTE — Assessment & Plan Note (Signed)
Patient is done very well since delivery.  No signs of depression.  Perineal lacerations repaired nicely, no residual tenderness from delivery.  Perform Pap smear today

## 2021-07-19 ENCOUNTER — Ambulatory Visit: Payer: 59 | Admitting: Nurse Practitioner

## 2021-10-12 ENCOUNTER — Ambulatory Visit (INDEPENDENT_AMBULATORY_CARE_PROVIDER_SITE_OTHER): Payer: 59

## 2021-10-12 DIAGNOSIS — Z3200 Encounter for pregnancy test, result unknown: Secondary | ICD-10-CM

## 2021-10-12 DIAGNOSIS — Z3201 Encounter for pregnancy test, result positive: Secondary | ICD-10-CM

## 2021-10-12 LAB — POCT URINE PREGNANCY: Preg Test, Ur: POSITIVE — AB

## 2021-10-12 MED ORDER — PRENATE MINI 18-0.6-0.4-350 MG PO CAPS: 1.0000 | ORAL_CAPSULE | Freq: Every day | ORAL | 12 refills | Status: AC

## 2021-10-12 NOTE — Addendum Note (Signed)
Addended by: Natale Milch D on: 10/12/2021 09:18 AM   Modules accepted: Orders

## 2021-10-12 NOTE — Progress Notes (Signed)
..  Amber Fuentes presents today for UPT. She has no unusual complaints. LMP:08/25/21    OBJECTIVE: Appears well, in no apparent distress.  OB History     Gravida  4   Para  3   Term  3   Preterm  0   AB      Living  3      SAB      IAB      Ectopic      Multiple  0   Live Births  3          Home UPT Result: Positive In-Office UPT result:Positive I have reviewed the patient's medical, obstetrical, social, and family histories, and medications.   ASSESSMENT: Positive pregnancy test  PLAN Prenatal care to be completed at: [facility]

## 2021-12-08 ENCOUNTER — Encounter: Payer: 59 | Admitting: Obstetrics & Gynecology

## 2021-12-08 ENCOUNTER — Institutional Professional Consult (permissible substitution): Payer: Self-pay | Admitting: Licensed Clinical Social Worker
